# Patient Record
Sex: Male | Born: 1960 | Race: Black or African American | Hispanic: No | Marital: Married | Smoking: Never smoker
Health system: Southern US, Community
[De-identification: ages and names within clinical notes are randomized; demographics above are authoritative.]

## PROBLEM LIST (undated history)

## (undated) DIAGNOSIS — C787 Secondary malignant neoplasm of liver and intrahepatic bile duct: Principal | ICD-10-CM

## (undated) DIAGNOSIS — C189 Malignant neoplasm of colon, unspecified: Secondary | ICD-10-CM

## (undated) DIAGNOSIS — I10 Essential (primary) hypertension: Secondary | ICD-10-CM

## (undated) HISTORY — DX: Malignant neoplasm of colon, unspecified: C18.9

## (undated) HISTORY — DX: Essential (primary) hypertension: I10

## (undated) HISTORY — DX: Secondary malignant neoplasm of liver and intrahepatic bile duct: C78.7

---

## 2012-01-17 DIAGNOSIS — C189 Malignant neoplasm of colon, unspecified: Secondary | ICD-10-CM

## 2012-01-17 HISTORY — DX: Malignant neoplasm of colon, unspecified: C18.9

## 2012-01-17 HISTORY — PX: COLON RESECTION: SHX5231

## 2012-08-26 ENCOUNTER — Telehealth: Payer: Self-pay | Admitting: Oncology

## 2012-08-26 NOTE — Telephone Encounter (Signed)
C/D 08/26/12 for appt. 09/06/12

## 2012-09-05 ENCOUNTER — Other Ambulatory Visit: Payer: Self-pay | Admitting: Medical Oncology

## 2012-09-06 ENCOUNTER — Other Ambulatory Visit (HOSPITAL_BASED_OUTPATIENT_CLINIC_OR_DEPARTMENT_OTHER): Payer: 59 | Admitting: Lab

## 2012-09-06 ENCOUNTER — Encounter: Payer: Self-pay | Admitting: Oncology

## 2012-09-06 ENCOUNTER — Ambulatory Visit: Payer: 59

## 2012-09-06 ENCOUNTER — Other Ambulatory Visit: Payer: Self-pay | Admitting: *Deleted

## 2012-09-06 ENCOUNTER — Ambulatory Visit (HOSPITAL_BASED_OUTPATIENT_CLINIC_OR_DEPARTMENT_OTHER): Payer: 59 | Admitting: Oncology

## 2012-09-06 ENCOUNTER — Encounter: Payer: Self-pay | Admitting: *Deleted

## 2012-09-06 VITALS — BP 124/82 | HR 75 | Temp 98.4°F | Resp 20 | Ht 73.0 in | Wt 201.0 lb

## 2012-09-06 DIAGNOSIS — C189 Malignant neoplasm of colon, unspecified: Secondary | ICD-10-CM

## 2012-09-06 DIAGNOSIS — C7800 Secondary malignant neoplasm of unspecified lung: Secondary | ICD-10-CM

## 2012-09-06 DIAGNOSIS — C787 Secondary malignant neoplasm of liver and intrahepatic bile duct: Secondary | ICD-10-CM

## 2012-09-06 DIAGNOSIS — C185 Malignant neoplasm of splenic flexure: Secondary | ICD-10-CM

## 2012-09-06 HISTORY — DX: Malignant neoplasm of colon, unspecified: C18.9

## 2012-09-06 HISTORY — DX: Malignant neoplasm of colon, unspecified: C78.7

## 2012-09-06 LAB — COMPREHENSIVE METABOLIC PANEL (CC13)
Alkaline Phosphatase: 84 U/L (ref 40–150)
BUN: 12.7 mg/dL (ref 7.0–26.0)
CO2: 25 mEq/L (ref 22–29)
Creatinine: 1.4 mg/dL — ABNORMAL HIGH (ref 0.7–1.3)
Glucose: 98 mg/dl (ref 70–140)
Sodium: 141 mEq/L (ref 136–145)
Total Bilirubin: 0.98 mg/dL (ref 0.20–1.20)

## 2012-09-06 LAB — CBC WITH DIFFERENTIAL/PLATELET
Basophils Absolute: 0 10*3/uL (ref 0.0–0.1)
EOS%: 7.6 % — ABNORMAL HIGH (ref 0.0–7.0)
HGB: 12.2 g/dL — ABNORMAL LOW (ref 13.0–17.1)
LYMPH%: 31.4 % (ref 14.0–49.0)
MCH: 28.4 pg (ref 27.2–33.4)
MCV: 88.1 fL (ref 79.3–98.0)
MONO%: 18.7 % — ABNORMAL HIGH (ref 0.0–14.0)
NEUT%: 42.1 % (ref 39.0–75.0)
Platelets: 202 10*3/uL (ref 140–400)
RDW: 20.9 % — ABNORMAL HIGH (ref 11.0–14.6)

## 2012-09-06 MED ORDER — DEXAMETHASONE 4 MG PO TABS: 8.0000 mg | ORAL_TABLET | Freq: Two times a day (BID) | ORAL | Status: DC

## 2012-09-06 MED ORDER — LORAZEPAM 0.5 MG PO TABS: 0.5000 mg | ORAL_TABLET | Freq: Four times a day (QID) | ORAL | Status: DC | PRN

## 2012-09-06 MED ORDER — PROCHLORPERAZINE MALEATE 10 MG PO TABS: 10.0000 mg | ORAL_TABLET | Freq: Four times a day (QID) | ORAL | Status: DC | PRN

## 2012-09-06 MED ORDER — ONDANSETRON HCL 8 MG PO TABS: 8.0000 mg | ORAL_TABLET | Freq: Two times a day (BID) | ORAL | Status: DC

## 2012-09-06 NOTE — Progress Notes (Signed)
Checked in new patient. No financial issues. No POA/living will. email is correct.

## 2012-09-07 LAB — CEA: CEA: 1.6 ng/mL (ref 0.0–5.0)

## 2012-09-13 ENCOUNTER — Telehealth: Payer: Self-pay | Admitting: Oncology

## 2012-09-13 NOTE — Telephone Encounter (Signed)
Per email response from Seward Grater ok to schedule pt tx on Fridays (folfox/avastin) with pump d/c on Mondays. Per Seward Grater this will be a 72hr pump. Pt scheduled for Friday 8/1 as the start date for 7/25 has already passed. S/w pt and informed him that arrangements to have tx on Fridays has been approved but d/c will be Mondays. Pt given next appt for 8/1 that he requested be moved this one time to a Thursday 7/31 due to new job orientation on Friday 8/1. appt moved to 7/31 and pt given d/t for lb/tx 7/31 @ 8:15am and will get new schedule when he comes in 7/31. email to KK/Maggie.

## 2012-09-13 NOTE — Telephone Encounter (Signed)
Add to previous note.... Due to pt starting tx 8/1 and 7/25 all appts were adjusted 1 week.

## 2012-09-14 ENCOUNTER — Encounter: Payer: Self-pay | Admitting: *Deleted

## 2012-09-15 ENCOUNTER — Ambulatory Visit (HOSPITAL_COMMUNITY)
Admission: RE | Admit: 2012-09-15 | Discharge: 2012-09-15 | Disposition: A | Discharge status: Home / Self Care | Payer: 59 | Source: Ambulatory Visit | Attending: Oncology | Admitting: Oncology

## 2012-09-15 ENCOUNTER — Other Ambulatory Visit (HOSPITAL_BASED_OUTPATIENT_CLINIC_OR_DEPARTMENT_OTHER): Payer: 59 | Admitting: Lab

## 2012-09-15 ENCOUNTER — Ambulatory Visit (HOSPITAL_BASED_OUTPATIENT_CLINIC_OR_DEPARTMENT_OTHER): Payer: 59

## 2012-09-15 DIAGNOSIS — Z5111 Encounter for antineoplastic chemotherapy: Secondary | ICD-10-CM

## 2012-09-15 DIAGNOSIS — C189 Malignant neoplasm of colon, unspecified: Secondary | ICD-10-CM

## 2012-09-15 DIAGNOSIS — C787 Secondary malignant neoplasm of liver and intrahepatic bile duct: Secondary | ICD-10-CM | POA: Insufficient documentation

## 2012-09-15 DIAGNOSIS — Z452 Encounter for adjustment and management of vascular access device: Secondary | ICD-10-CM

## 2012-09-15 DIAGNOSIS — Z5112 Encounter for antineoplastic immunotherapy: Secondary | ICD-10-CM

## 2012-09-15 LAB — UA PROTEIN, DIPSTICK - CHCC: Protein, ur: NEGATIVE mg/dL

## 2012-09-15 LAB — COMPREHENSIVE METABOLIC PANEL (CC13)
BUN: 9.7 mg/dL (ref 7.0–26.0)
CO2: 23 mEq/L (ref 22–29)
Calcium: 9.7 mg/dL (ref 8.4–10.4)
Chloride: 105 mEq/L (ref 98–109)
Creatinine: 1.3 mg/dL (ref 0.7–1.3)

## 2012-09-15 LAB — CBC WITH DIFFERENTIAL/PLATELET
Basophils Absolute: 0 10*3/uL (ref 0.0–0.1)
EOS%: 18.4 % — ABNORMAL HIGH (ref 0.0–7.0)
HCT: 35.3 % — ABNORMAL LOW (ref 38.4–49.9)
HGB: 11.5 g/dL — ABNORMAL LOW (ref 13.0–17.1)
MCH: 29.1 pg (ref 27.2–33.4)
MONO#: 0.7 10*3/uL (ref 0.1–0.9)
NEUT%: 35.4 % — ABNORMAL LOW (ref 39.0–75.0)
Platelets: 193 10*3/uL (ref 140–400)
lymph#: 1.5 10*3/uL (ref 0.9–3.3)

## 2012-09-15 MED ORDER — FLUOROURACIL CHEMO INJECTION 2.5 GM/50ML
400.0000 mg/m2 | Freq: Once | INTRAVENOUS | Status: AC
  Administered 2012-09-15: 850 mg via INTRAVENOUS
  Filled 2012-09-15: qty 17

## 2012-09-15 MED ORDER — DEXAMETHASONE SODIUM PHOSPHATE 10 MG/ML IJ SOLN
10.0000 mg | Freq: Once | INTRAMUSCULAR | Status: AC
  Administered 2012-09-15: 10 mg via INTRAVENOUS

## 2012-09-15 MED ORDER — DEXTROSE 5 % IV SOLN
Freq: Once | INTRAVENOUS | Status: AC
  Administered 2012-09-15: 12:00:00 via INTRAVENOUS

## 2012-09-15 MED ORDER — ALTEPLASE 2 MG IJ SOLR
2.0000 mg | Freq: Once | INTRAMUSCULAR | Status: AC | PRN
  Administered 2012-09-15: 2 mg
  Filled 2012-09-15: qty 2

## 2012-09-15 MED ORDER — SODIUM CHLORIDE 0.9 % IV SOLN: Freq: Once | INTRAVENOUS | Status: DC

## 2012-09-15 MED ORDER — OXALIPLATIN CHEMO INJECTION 100 MG/20ML
85.0000 mg/m2 | Freq: Once | INTRAVENOUS | Status: AC
  Administered 2012-09-15: 185 mg via INTRAVENOUS
  Filled 2012-09-15: qty 37

## 2012-09-15 MED ORDER — ONDANSETRON 8 MG/50ML IVPB (CHCC)
8.0000 mg | Freq: Once | INTRAVENOUS | Status: AC
  Administered 2012-09-15: 8 mg via INTRAVENOUS

## 2012-09-15 MED ORDER — LEUCOVORIN CALCIUM INJECTION 350 MG
400.0000 mg/m2 | Freq: Once | INTRAVENOUS | Status: AC
  Administered 2012-09-15: 868 mg via INTRAVENOUS
  Filled 2012-09-15: qty 43.4

## 2012-09-15 MED ORDER — BEVACIZUMAB CHEMO INJECTION 400 MG/16ML
5.0000 mg/kg | Freq: Once | INTRAVENOUS | Status: AC
  Administered 2012-09-15: 450 mg via INTRAVENOUS
  Filled 2012-09-15: qty 18

## 2012-09-15 MED ORDER — SODIUM CHLORIDE 0.9 % IV SOLN
2400.0000 mg/m2 | INTRAVENOUS | Status: DC
  Administered 2012-09-15: 5200 mg via INTRAVENOUS
  Filled 2012-09-15: qty 104

## 2012-09-15 NOTE — Patient Instructions (Addendum)
[facility] Discharge Instructions for Patients Receiving Chemotherapy  Today you received the following chemotherapy agents Oxaliplatin/Leucovorin/5FU/Avastin.  To help prevent nausea and vomiting after your treatment, we encourage you to take your nausea medication as needed.   If you develop nausea and vomiting that is not controlled by your nausea medication, call the clinic.   BELOW ARE SYMPTOMS THAT SHOULD BE REPORTED IMMEDIATELY:  *FEVER GREATER THAN 100.5 F  *CHILLS WITH OR WITHOUT FEVER  NAUSEA AND VOMITING THAT IS NOT CONTROLLED WITH YOUR NAUSEA MEDICATION  *UNUSUAL SHORTNESS OF BREATH  *UNUSUAL BRUISING OR BLEEDING  TENDERNESS IN MOUTH AND THROAT WITH OR WITHOUT PRESENCE OF ULCERS  *URINARY PROBLEMS  *BOWEL PROBLEMS  UNUSUAL RASH Items with * indicate a potential emergency and should be followed up as soon as possible.  Feel free to call the clinic you have any questions or concerns. The clinic phone number is [phone].

## 2012-09-15 NOTE — Progress Notes (Signed)
Unable to get positive blood return from left chest PAC. PAC placed at [facility] in [place]. Sending patient for CXR to verify placement before instilling TPA or infusion of chemotherapy. 1035--TPA removed, excellent blood return, flushes well, drips to gravity.

## 2012-09-16 ENCOUNTER — Other Ambulatory Visit: Payer: 59 | Admitting: Lab

## 2012-09-16 ENCOUNTER — Ambulatory Visit: Payer: 59

## 2012-09-17 ENCOUNTER — Ambulatory Visit (HOSPITAL_BASED_OUTPATIENT_CLINIC_OR_DEPARTMENT_OTHER): Payer: 59

## 2012-09-17 VITALS — BP 153/86 | HR 62 | Temp 98.6°F

## 2012-09-17 DIAGNOSIS — Z5189 Encounter for other specified aftercare: Secondary | ICD-10-CM

## 2012-09-17 DIAGNOSIS — C189 Malignant neoplasm of colon, unspecified: Secondary | ICD-10-CM

## 2012-09-17 DIAGNOSIS — C787 Secondary malignant neoplasm of liver and intrahepatic bile duct: Secondary | ICD-10-CM

## 2012-09-17 MED ORDER — PEGFILGRASTIM INJECTION 6 MG/0.6ML
6.0000 mg | Freq: Once | SUBCUTANEOUS | Status: AC
  Administered 2012-09-17: 6 mg via SUBCUTANEOUS

## 2012-09-17 MED ORDER — HEPARIN SOD (PORK) LOCK FLUSH 100 UNIT/ML IV SOLN
500.0000 [IU] | Freq: Once | INTRAVENOUS | Status: AC | PRN
  Administered 2012-09-17: 500 [IU]
  Filled 2012-09-17: qty 5

## 2012-09-17 MED ORDER — SODIUM CHLORIDE 0.9 % IJ SOLN
10.0000 mL | INTRAMUSCULAR | Status: DC | PRN
  Administered 2012-09-17: 10 mL
  Filled 2012-09-17: qty 10

## 2012-09-17 NOTE — Patient Instructions (Addendum)

## 2012-09-29 NOTE — Progress Notes (Signed)
[facility]  [phone] [phone]   MEDICAL ONCOLOGY - INITIAL CONSULATION    Referral MD Dr. Gaylord Shih  Reason for Referral: 52 year old gentleman with metastatic colon carcinoma.  Chief Complaint  Patient presents with  . new patient  : metastatic colon cancer Osteoarthritis Hypertension  HPI: patient is a very pleasant 52 year old gentleman with metastatic colon carcinoma who originally was diagnosed in [place] and use [place]. He is now transferring his care to [facility]. His oncologic history dates back to   December 2013: when patient developed weeks of mild abdominal pain or decreased appetite nausea vomiting. He had his CT in the ER that revealed inflammatory: Mass at the splenic flexure metastasis were obvious liver no white neuropathy.   December 2013: he was taken to the operating room for a large bowel obstruction under went partial colectomy with site to.side functional and N. To end staple anastomosis. Pathology revealed invasive adenocarcinoma low grade 6.0 cm tumor no lymphovascular invasion or perineural invasion. 0 of 15 lymph nodes were positive margins were negative. Pathologic stage TIII N0 DNA MMR staining by immunohistochemistry was intact K.RAS mutation not found at exon 1 &2 (including codone 12, 13, 14, 61)  January 2014 CEA level was elevated at 120.  February 2014:  CT chest abdomen pelvis: Multiple bilateral lung masses at least 5 lesions in each lung, 2 cm lesion noted in segment of liver, multiple small mesenteric lymph nodes which were stable and not pathologically enlarged no bony lesions.  February 2014 right liver biopsy of segment 5: Metastatic adenocarcinoma consistent with colon origin, positive for CK 20 CDX2 and villin negative for PSA TTF-1 and CK 25 March 2012: Started on modified FOLFOX 6+ Avastin chemotherapy  April 2014 CEA level 35 after third cycle of treatment CT chest  abdomen pelvis: Stable bilateral subcentimeter pulmonary nodules mild decrease in left lower lobe nodule decrease in size of lesion within the right hepatic lobe no new lesions seen  May 2014 CEA level 11.22 July 2012:  completed eighth cycle of treatment with modified FOLFOX 6+ Avastin chemotherapy     Past Medical History  Diagnosis Date  . Colon cancer 01/2012    stage IV   . 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 KLOR-CON M20 20 MEQ tablet Take 20 mEq by mouth daily.      Marland Kitchen losartan-hydrochlorothiazide (HYZAAR) 100-25 MG per tablet Take by mouth daily.      Marland Kitchen dexamethasone (DECADRON) 4 MG tablet Take 2 tablets (8 mg total) by mouth 2 (two) times daily with a meal. Take daily starting the day after chemotherapy for 2 days. Take with food.  30 tablet  1  . LORazepam (ATIVAN) 0.5 MG tablet Take 1 tablet (0.5 mg total) by mouth every 6 (six) hours as needed (Nausea or vomiting).  30 tablet  0  . ondansetron (ZOFRAN) 8 MG tablet Take 1 tablet (8 mg total) by mouth 2 (two) times daily. Take two times a day starting the day after chemo for 2 days. Then take two times a day as needed for nausea or vomiting.  30 tablet  1  . prochlorperazine (COMPAZINE) 10 MG tablet Take 1 tablet (10 mg total) by mouth every 6 (six) hours as needed (Nausea or vomiting).  30 tablet  1   No current facility-administered medications for this visit.  Allergies  Allergen Reactions  . Other Hives and Swelling    Sea food  :  No family history on file.:  History   Social History  . Marital Status: Married    Spouse Name: N/A    Number of Children: N/A  . Years of Education: N/A   Occupational History  . Not on file.   Social History Main Topics  . Smoking status: Not on file  . Smokeless tobacco: Not on file  . Alcohol Use: Not on file  . Drug Use: Not on file  .  Sexual Activity: Not on file   Other Topics Concern  . Not on file   Social History Narrative  . No narrative on file  :  A comprehensive review of systems was negative.  Exam: @IPVITALS @  General:  well-nourished in no acute distress.  Eyes:  no scleral icterus.  ENT:  There were no oropharyngeal lesions.  Neck was without thyromegaly.  Lymphatics:  Negative cervical, supraclavicular or axillary adenopathy.  Respiratory: lungs were clear bilaterally without wheezing or crackles.  Cardiovascular:  Regular rate and rhythm, S1/S2, without murmur, rub or gallop.  There was no pedal edema.  GI:  abdomen was soft, flat, nontender, nondistended, without organomegaly.  Muscoloskeletal:  no spinal tenderness of palpation of vertebral spine.  Skin exam was without echymosis, petichae.  Neuro exam was nonfocal.  Patient was able to get on and off exam table without assistance.  Gait was normal.  Patient was alerted and oriented.  Attention was good.   Language was appropriate.  Mood was normal without depression.  Speech was not pressured.  Thought content was not tangential.     Lab Results  Component Value Date   WBC 5.0 09/15/2012   HGB 11.5* 09/15/2012   HCT 35.3* 09/15/2012   PLT 193 09/15/2012   GLUCOSE 144* 09/15/2012   ALT 15 09/15/2012   AST 27 09/15/2012   NA 141 09/15/2012   K 3.8 09/15/2012   CREATININE 1.3 09/15/2012   BUN 9.7 09/15/2012   CO2 23 09/15/2012    Dg Chest 2 View  09/15/2012   *RADIOLOGY REPORT*  Clinical Data: Port-A-Cath placement.  CHEST - 2 VIEW  Comparison: No priors.  Findings: Left-sided subclavian Port-A-Cath with tip terminating in the distal superior vena cava. Lung volumes are normal.  No consolidative airspace disease.  No pleural effusions.  No pneumothorax.  No pulmonary nodule or mass noted.  Pulmonary vasculature and the cardiomediastinal silhouette are within normal limits.  IMPRESSION: 1. No radiographic evidence of acute cardiopulmonary disease. 2.  Tip of  Port-A-Cath in the distal superior vena cava.   Original Report Authenticated By: Trudie Reed, M.D.    Dg Chest 2 View  09/15/2012   *RADIOLOGY REPORT*  Clinical Data: Port-A-Cath placement.  CHEST - 2 VIEW  Comparison: No priors.  Findings: Left-sided subclavian Port-A-Cath with tip terminating in the distal superior vena cava. Lung volumes are normal.  No consolidative airspace disease.  No pleural effusions.  No pneumothorax.  No pulmonary nodule or mass noted.  Pulmonary vasculature and the cardiomediastinal silhouette are within normal limits.  IMPRESSION: 1. No radiographic evidence of acute cardiopulmonary disease. 2.  Tip of Port-A-Cath in the distal superior vena cava.   Original Report Authenticated By: Trudie Reed, M.D.    Assessment and Plan: 52 year old gentleman with a T3, N0, M1 invasive adenocarcinoma of the colon at the splenic flexure diagnosed in December 2013. He was initially thought to have stage  II disease but on initial medical oncology workup he was found to have an elevated CEA level necessitating further staging workup. Pulmonary and liver metastasis were found on CT scan in February 2014. The patient was asymptomatic. He was started on FOLFOX 6+ Avastin based chemotherapy and tolerated it extremely well with improvement in CEA level and response on CT scans in April 2014.  June 2014 he received 8 of 12 planned cycle of modified FOLFOX 6+ Avastin chemotherapy. The patient does have cold sensitivity in her hands and feet but he is asymptomatic at room temperature. He is also noticed darkening of the skin of her palms and hands. He is a very high functional status.  Plan #1 metastatic colon cancer with lung and liver metastasis stage IV KRAS wild type, diagnosed December 2013. He has completed eighth cycle of modified FOLFOX 6+ Avastin chemotherapy in [place]. But now he is going to proceed with cycle #9 here at [facility].  #2 patient will need to  do restaging CT scans eventually. 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  #4 liver metastasis these are felt not to be resectable due to the presence of additional metastases in the lung it is unclear whether chemoembolization may or may not be benefit  #5 hypertension unclear whether this is due to Avastin we will continue to control it he is on losartan hydrochlorothiazide 100/25 mg one tablet daily.  #6 patient will proceed with cycle #9 of FOLFOX 6 and Avastin this coming Friday. Patient would like to have Friday appointments due to his workup. The we will try to accommodate him.  Drue Second, MD Medical/Oncology [facility] [phone] (beeper) [phone] (Office)

## 2012-09-30 ENCOUNTER — Encounter: Payer: Self-pay | Admitting: Oncology

## 2012-09-30 ENCOUNTER — Other Ambulatory Visit (HOSPITAL_BASED_OUTPATIENT_CLINIC_OR_DEPARTMENT_OTHER): Payer: 59 | Admitting: Lab

## 2012-09-30 ENCOUNTER — Ambulatory Visit (HOSPITAL_BASED_OUTPATIENT_CLINIC_OR_DEPARTMENT_OTHER): Payer: 59 | Admitting: Oncology

## 2012-09-30 ENCOUNTER — Ambulatory Visit (HOSPITAL_BASED_OUTPATIENT_CLINIC_OR_DEPARTMENT_OTHER): Payer: 59

## 2012-09-30 VITALS — BP 138/92 | HR 70 | Temp 98.2°F | Resp 18 | Ht 73.0 in | Wt 208.7 lb

## 2012-09-30 DIAGNOSIS — G589 Mononeuropathy, unspecified: Secondary | ICD-10-CM

## 2012-09-30 DIAGNOSIS — I10 Essential (primary) hypertension: Secondary | ICD-10-CM

## 2012-09-30 DIAGNOSIS — C185 Malignant neoplasm of splenic flexure: Secondary | ICD-10-CM

## 2012-09-30 DIAGNOSIS — Z5112 Encounter for antineoplastic immunotherapy: Secondary | ICD-10-CM

## 2012-09-30 DIAGNOSIS — C787 Secondary malignant neoplasm of liver and intrahepatic bile duct: Secondary | ICD-10-CM

## 2012-09-30 DIAGNOSIS — Z5111 Encounter for antineoplastic chemotherapy: Secondary | ICD-10-CM

## 2012-09-30 DIAGNOSIS — C7800 Secondary malignant neoplasm of unspecified lung: Secondary | ICD-10-CM

## 2012-09-30 DIAGNOSIS — C189 Malignant neoplasm of colon, unspecified: Secondary | ICD-10-CM

## 2012-09-30 LAB — CBC WITH DIFFERENTIAL/PLATELET
Basophils Absolute: 0 10*3/uL (ref 0.0–0.1)
Eosinophils Absolute: 0.3 10*3/uL (ref 0.0–0.5)
LYMPH%: 36.2 % (ref 14.0–49.0)
MCV: 90.1 fL (ref 79.3–98.0)
MONO%: 15.3 % — ABNORMAL HIGH (ref 0.0–14.0)
NEUT#: 1.8 10*3/uL (ref 1.5–6.5)
Platelets: 142 10*3/uL (ref 140–400)
RBC: 4.04 10*6/uL — ABNORMAL LOW (ref 4.20–5.82)

## 2012-09-30 LAB — COMPREHENSIVE METABOLIC PANEL (CC13)
Alkaline Phosphatase: 99 U/L (ref 40–150)
BUN: 10.9 mg/dL (ref 7.0–26.0)
Creatinine: 1.1 mg/dL (ref 0.7–1.3)
Glucose: 111 mg/dl (ref 70–140)
Sodium: 140 mEq/L (ref 136–145)
Total Bilirubin: 0.28 mg/dL (ref 0.20–1.20)

## 2012-09-30 MED ORDER — DEXAMETHASONE SODIUM PHOSPHATE 10 MG/ML IJ SOLN
10.0000 mg | Freq: Once | INTRAMUSCULAR | Status: AC
  Administered 2012-09-30: 10 mg via INTRAVENOUS

## 2012-09-30 MED ORDER — LEUCOVORIN CALCIUM INJECTION 350 MG
400.0000 mg/m2 | Freq: Once | INTRAVENOUS | Status: AC
  Administered 2012-09-30: 868 mg via INTRAVENOUS
  Filled 2012-09-30: qty 43.4

## 2012-09-30 MED ORDER — ONDANSETRON 8 MG/50ML IVPB (CHCC)
8.0000 mg | Freq: Once | INTRAVENOUS | Status: AC
  Administered 2012-09-30: 8 mg via INTRAVENOUS

## 2012-09-30 MED ORDER — FLUOROURACIL CHEMO INJECTION 2.5 GM/50ML
400.0000 mg/m2 | Freq: Once | INTRAVENOUS | Status: AC
  Administered 2012-09-30: 850 mg via INTRAVENOUS
  Filled 2012-09-30: qty 17

## 2012-09-30 MED ORDER — DEXTROSE 5 % IV SOLN
Freq: Once | INTRAVENOUS | Status: AC
  Administered 2012-09-30: 12:00:00 via INTRAVENOUS

## 2012-09-30 MED ORDER — OXALIPLATIN CHEMO INJECTION 100 MG/20ML
85.0000 mg/m2 | Freq: Once | INTRAVENOUS | Status: AC
  Administered 2012-09-30: 185 mg via INTRAVENOUS
  Filled 2012-09-30: qty 37

## 2012-09-30 MED ORDER — SODIUM CHLORIDE 0.9 % IV SOLN
5.0000 mg/kg | Freq: Once | INTRAVENOUS | Status: AC
  Administered 2012-09-30: 450 mg via INTRAVENOUS
  Filled 2012-09-30: qty 18

## 2012-09-30 MED ORDER — SODIUM CHLORIDE 0.9 % IV SOLN
Freq: Once | INTRAVENOUS | Status: AC
  Administered 2012-09-30: 11:00:00 via INTRAVENOUS

## 2012-09-30 MED ORDER — SODIUM CHLORIDE 0.9 % IV SOLN
2400.0000 mg/m2 | INTRAVENOUS | Status: DC
  Administered 2012-09-30: 5200 mg via INTRAVENOUS
  Filled 2012-09-30: qty 104

## 2012-09-30 NOTE — Progress Notes (Signed)
OFFICE PROGRESS NOTE  CC**  No PCP Per Patient [address] [place] [address] Dr. Gaylord Shih  DIAGNOSIS: 52 year old gentleman with metastatic colon carcinoma who originally was diagnosed in [place] and use [place]   PRIOR THERAPY: #17 January 2012: when patient developed weeks of mild abdominal pain or decreased appetite nausea vomiting. He had his CT in the ER that revealed inflammatory: Mass at the splenic flexure metastasis were obvious liver no white neuropathy.  December 2013: he was taken to the operating room for a large bowel obstruction under went partial colectomy with site to.side functional and N. To end staple anastomosis. Pathology revealed invasive adenocarcinoma low grade 6.0 cm tumor no lymphovascular invasion or perineural invasion. 0 of 15 lymph nodes were positive margins were negative. Pathologic stage TIII N0 DNA MMR staining by immunohistochemistry was intact K.RAS mutation not found at exon 1 &2 (including codone 12, 13, 14, 61)  #18 February 2012 CEA level was elevated at 120.  February 2014: CT chest abdomen pelvis: Multiple bilateral lung masses at least 5 lesions in each lung, 2 cm lesion noted in segment of liver, multiple small mesenteric lymph nodes which were stable and not pathologically enlarged no bony lesions.  February 2014 right liver biopsy of segment 5: Metastatic adenocarcinoma consistent with colon origin, positive for CK 20 CDX2 and villin negative for PSA TTF-1 and CK 7    #21 March 2012: Started on modified FOLFOX 6+ Avastin chemotherapy  April 2014 CEA level 35 after third cycle of treatment CT chest abdomen pelvis: Stable bilateral subcentimeter pulmonary nodules mild decrease in left lower lobe nodule decrease in size of lesion within the right hepatic lobe no new lesions seen  May 2014 CEA level 11.22 July 2012: completed eighth cycle of treatment with modified FOLFOX 6+ Avastin chemotherapy    CURRENT THERAPY: Cycle 9 of  modified FOLFOX/Avastin with day 4 Neulasta  INTERVAL HISTORY: Maccoy Haubner 52 y.o. male returns for followup visit today prior to cycle #9 of FOLFOX. Overall he is doing remarkably well. He denies any fevers chills night sweats headaches shortness of breath chest pains palpitations or cold sensitivities. Remainder of the 10 point review of systems is  MEDICAL HISTORY: Past Medical History  Diagnosis Date  . Colon cancer 01/2012    stage IV   . 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  MEDICATIONS:  Current Outpatient Prescriptions  Medication Sig Dispense Refill  . dexamethasone (DECADRON) 4 MG tablet Take 2 tablets (8 mg total) by mouth 2 (two) times daily with a meal. Take daily starting the day after chemotherapy for 2 days. Take with food.  30 tablet  1  . LORazepam (ATIVAN) 0.5 MG tablet Take 1 tablet (0.5 mg total) by mouth every 6 (six) hours as needed (Nausea or vomiting).  30 tablet  0  . losartan-hydrochlorothiazide (HYZAAR) 100-25 MG per tablet Take by mouth daily.      . ondansetron (ZOFRAN) 8 MG tablet Take 1 tablet (8 mg total) by mouth 2 (two) times daily. Take two times a day starting the day after chemo for 2 days. Then take two times a day as needed for nausea or vomiting.  30 tablet  1  . prochlorperazine (COMPAZINE) 10 MG tablet Take 1 tablet (10 mg total) by mouth every 6 (six) hours as needed (Nausea or vomiting).  30 tablet  1  . KLOR-CON M20 20 MEQ tablet Take 20  mEq by mouth daily.       No current facility-administered medications for this visit.    SURGICAL HISTORY: History reviewed. No pertinent past surgical history.  REVIEW OF SYSTEMS:  Pertinent items are noted in HPI.   HEALTH MAINTENANCE:  PHYSICAL EXAMINATION: Blood pressure 138/92, pulse 70, temperature 98.2 F (36.8 C), temperature source Oral, resp. rate 18, height 6\' 1"  (1.854 m), weight 208 lb 11.2 oz (94.666 kg), SpO2 100.00%. Body mass  index is 27.54 kg/(m^2). ECOG PERFORMANCE STATUS: 0 - Asymptomatic   General appearance: alert, cooperative and appears stated age Resp: clear to auscultation bilaterally Cardio: regular rate and rhythm GI: soft, non-tender; bowel sounds normal; no masses,  no organomegaly Extremities: extremities normal, atraumatic, no cyanosis or edema Neurologic: Grossly normal   LABORATORY DATA: Lab Results  Component Value Date   WBC 4.4 09/30/2012   HGB 11.5* 09/30/2012   HCT 36.4* 09/30/2012   MCV 90.1 09/30/2012   PLT 142 09/30/2012      Chemistry      Component Value Date/Time   NA 140 09/30/2012 0817   K 3.5 09/30/2012 0817   CO2 24 09/30/2012 0817   BUN 10.9 09/30/2012 0817   CREATININE 1.1 09/30/2012 0817      Component Value Date/Time   CALCIUM 9.3 09/30/2012 0817   ALKPHOS 99 09/30/2012 0817   AST 25 09/30/2012 0817   ALT 12 09/30/2012 0817   BILITOT 0.28 09/30/2012 0817       RADIOGRAPHIC STUDIES:  Dg Chest 2 View  09/15/2012   *RADIOLOGY REPORT*  Clinical Data: Port-A-Cath placement.  CHEST - 2 VIEW  Comparison: No priors.  Findings: Left-sided subclavian Port-A-Cath with tip terminating in the distal superior vena cava. Lung volumes are normal.  No consolidative airspace disease.  No pleural effusions.  No pneumothorax.  No pulmonary nodule or mass noted.  Pulmonary vasculature and the cardiomediastinal silhouette are within normal limits.  IMPRESSION: 1. No radiographic evidence of acute cardiopulmonary disease. 2.  Tip of Port-A-Cath in the distal superior vena cava.   Original Report Authenticated By: Trudie Reed, M.D.    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 Patient is receiving palliative chemotherapy with modified FOLFOX 6 and Avastin. He is now here for cycle #9 of his treatment.  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  #3 neuropathy: Stable   PLAN:   #1 proceed  with modified FOLFOX 6 and Avastin today.  #2 he will return on August 18 to have his pump disconnected and he will receive Neulasta.  #3 I will see him back in 2 weeks' time for cycle #10 of his chemotherapy.   All questions were answered. The patient knows to call the clinic with any problems, questions or concerns. We can certainly see the patient much sooner if necessary.  I spent 25 minutes counseling the patient face to face. The total time spent in the appointment was 30 minutes.    Drue Second, MD Medical/Oncology [facility] [phone] (beeper) [phone] (Office)  09/30/2012, 9:22 AM

## 2012-09-30 NOTE — Patient Instructions (Signed)
[facility] Discharge Instructions for Patients Receiving Chemotherapy  Today you received the following chemotherapy agents : Avastin, Oxaliplatin, Leucovorin, 5FU.  To help prevent nausea and vomiting after your treatment, we encourage you to take your nausea medication as instructed by your physician.   If you develop nausea and vomiting that is not controlled by your nausea medication, call the clinic.   BELOW ARE SYMPTOMS THAT SHOULD BE REPORTED IMMEDIATELY:  *FEVER GREATER THAN 100.5 F  *CHILLS WITH OR WITHOUT FEVER  NAUSEA AND VOMITING THAT IS NOT CONTROLLED WITH YOUR NAUSEA MEDICATION  *UNUSUAL SHORTNESS OF BREATH  *UNUSUAL BRUISING OR BLEEDING  TENDERNESS IN MOUTH AND THROAT WITH OR WITHOUT PRESENCE OF ULCERS  *URINARY PROBLEMS  *BOWEL PROBLEMS  UNUSUAL RASH Items with * indicate a potential emergency and should be followed up as soon as possible.  Feel free to call the clinic you have any questions or concerns. The clinic phone number is [phone].

## 2012-10-03 ENCOUNTER — Ambulatory Visit (HOSPITAL_BASED_OUTPATIENT_CLINIC_OR_DEPARTMENT_OTHER): Payer: 59

## 2012-10-03 DIAGNOSIS — C189 Malignant neoplasm of colon, unspecified: Secondary | ICD-10-CM

## 2012-10-03 DIAGNOSIS — C185 Malignant neoplasm of splenic flexure: Secondary | ICD-10-CM

## 2012-10-03 DIAGNOSIS — C7800 Secondary malignant neoplasm of unspecified lung: Secondary | ICD-10-CM

## 2012-10-03 DIAGNOSIS — C787 Secondary malignant neoplasm of liver and intrahepatic bile duct: Secondary | ICD-10-CM

## 2012-10-03 DIAGNOSIS — Z5189 Encounter for other specified aftercare: Secondary | ICD-10-CM

## 2012-10-03 MED ORDER — HEPARIN SOD (PORK) LOCK FLUSH 100 UNIT/ML IV SOLN
500.0000 [IU] | Freq: Once | INTRAVENOUS | Status: AC | PRN
  Administered 2012-10-03: 500 [IU]
  Filled 2012-10-03: qty 5

## 2012-10-03 MED ORDER — PEGFILGRASTIM INJECTION 6 MG/0.6ML
6.0000 mg | Freq: Once | SUBCUTANEOUS | Status: AC
  Administered 2012-10-03: 6 mg via SUBCUTANEOUS
  Filled 2012-10-03: qty 0.6

## 2012-10-03 MED ORDER — SODIUM CHLORIDE 0.9 % IJ SOLN
10.0000 mL | INTRAMUSCULAR | Status: DC | PRN
  Administered 2012-10-03: 10 mL
  Filled 2012-10-03: qty 10

## 2012-10-03 NOTE — Progress Notes (Signed)
Patient had 20 mL left to infuse. Patient stated he had to go to work and could not return later today for pump D/C. Augustin Schooling, NP notified. Pump removed port de accessed, neulasta injection given.

## 2012-10-14 ENCOUNTER — Other Ambulatory Visit (HOSPITAL_BASED_OUTPATIENT_CLINIC_OR_DEPARTMENT_OTHER): Payer: 59

## 2012-10-14 ENCOUNTER — Ambulatory Visit: Payer: 59

## 2012-10-14 ENCOUNTER — Telehealth: Payer: Self-pay | Admitting: Oncology

## 2012-10-14 ENCOUNTER — Ambulatory Visit (HOSPITAL_BASED_OUTPATIENT_CLINIC_OR_DEPARTMENT_OTHER): Payer: 59 | Admitting: Oncology

## 2012-10-14 ENCOUNTER — Encounter: Payer: Self-pay | Admitting: Oncology

## 2012-10-14 VITALS — BP 157/89 | HR 71 | Temp 98.1°F | Resp 20 | Ht 73.0 in | Wt 202.2 lb

## 2012-10-14 DIAGNOSIS — C189 Malignant neoplasm of colon, unspecified: Secondary | ICD-10-CM

## 2012-10-14 DIAGNOSIS — C7800 Secondary malignant neoplasm of unspecified lung: Secondary | ICD-10-CM

## 2012-10-14 DIAGNOSIS — C787 Secondary malignant neoplasm of liver and intrahepatic bile duct: Secondary | ICD-10-CM

## 2012-10-14 DIAGNOSIS — G609 Hereditary and idiopathic neuropathy, unspecified: Secondary | ICD-10-CM

## 2012-10-14 DIAGNOSIS — C185 Malignant neoplasm of splenic flexure: Secondary | ICD-10-CM

## 2012-10-14 DIAGNOSIS — I10 Essential (primary) hypertension: Secondary | ICD-10-CM

## 2012-10-14 LAB — COMPREHENSIVE METABOLIC PANEL (CC13)
ALT: 13 U/L (ref 0–55)
Albumin: 3.4 g/dL — ABNORMAL LOW (ref 3.5–5.0)
CO2: 25 mEq/L (ref 22–29)
Calcium: 9.4 mg/dL (ref 8.4–10.4)
Chloride: 106 mEq/L (ref 98–109)
Creatinine: 1.1 mg/dL (ref 0.7–1.3)
Potassium: 3.6 mEq/L (ref 3.5–5.1)
Total Protein: 7.5 g/dL (ref 6.4–8.3)

## 2012-10-14 LAB — CBC WITH DIFFERENTIAL/PLATELET
BASO%: 0.5 % (ref 0.0–2.0)
EOS%: 0.9 % (ref 0.0–7.0)
HCT: 35.6 % — ABNORMAL LOW (ref 38.4–49.9)
LYMPH%: 22.1 % (ref 14.0–49.0)
MCH: 29.2 pg (ref 27.2–33.4)
MCHC: 32.6 g/dL (ref 32.0–36.0)
NEUT%: 60.2 % (ref 39.0–75.0)
Platelets: 181 10*3/uL (ref 140–400)

## 2012-10-14 LAB — UA PROTEIN, DIPSTICK - CHCC: Protein, ur: NEGATIVE mg/dL

## 2012-10-14 MED ORDER — GABAPENTIN 100 MG PO CAPS: 100.0000 mg | ORAL_CAPSULE | Freq: Three times a day (TID) | ORAL | Status: DC

## 2012-10-14 MED ORDER — CEPHALEXIN 500 MG PO CAPS: 500.0000 mg | ORAL_CAPSULE | Freq: Four times a day (QID) | ORAL | Status: DC

## 2012-10-14 NOTE — Patient Instructions (Addendum)
Proceed with chemotherapy on 9/4  Keflex 500 mg four times a day  neurontin 100 mg three times a day   Take super B complex daily

## 2012-10-20 ENCOUNTER — Ambulatory Visit: Payer: 59 | Admitting: Oncology

## 2012-10-20 ENCOUNTER — Ambulatory Visit (HOSPITAL_BASED_OUTPATIENT_CLINIC_OR_DEPARTMENT_OTHER): Payer: 59 | Admitting: Oncology

## 2012-10-20 ENCOUNTER — Other Ambulatory Visit: Payer: 59 | Admitting: Lab

## 2012-10-20 ENCOUNTER — Encounter: Payer: Self-pay | Admitting: Oncology

## 2012-10-20 ENCOUNTER — Telehealth: Payer: Self-pay | Admitting: *Deleted

## 2012-10-20 ENCOUNTER — Ambulatory Visit (HOSPITAL_BASED_OUTPATIENT_CLINIC_OR_DEPARTMENT_OTHER): Payer: 59

## 2012-10-20 ENCOUNTER — Other Ambulatory Visit (HOSPITAL_BASED_OUTPATIENT_CLINIC_OR_DEPARTMENT_OTHER): Payer: 59 | Admitting: Lab

## 2012-10-20 VITALS — BP 135/82 | HR 69 | Temp 98.6°F | Resp 20 | Ht 73.0 in | Wt 205.0 lb

## 2012-10-20 DIAGNOSIS — Z5112 Encounter for antineoplastic immunotherapy: Secondary | ICD-10-CM

## 2012-10-20 DIAGNOSIS — Z5111 Encounter for antineoplastic chemotherapy: Secondary | ICD-10-CM

## 2012-10-20 DIAGNOSIS — C787 Secondary malignant neoplasm of liver and intrahepatic bile duct: Secondary | ICD-10-CM

## 2012-10-20 DIAGNOSIS — C7800 Secondary malignant neoplasm of unspecified lung: Secondary | ICD-10-CM

## 2012-10-20 DIAGNOSIS — C189 Malignant neoplasm of colon, unspecified: Secondary | ICD-10-CM

## 2012-10-20 DIAGNOSIS — C185 Malignant neoplasm of splenic flexure: Secondary | ICD-10-CM

## 2012-10-20 DIAGNOSIS — I158 Other secondary hypertension: Secondary | ICD-10-CM

## 2012-10-20 DIAGNOSIS — G622 Polyneuropathy due to other toxic agents: Secondary | ICD-10-CM

## 2012-10-20 LAB — CBC WITH DIFFERENTIAL/PLATELET
BASO%: 1 % (ref 0.0–2.0)
EOS%: 1.7 % (ref 0.0–7.0)
HGB: 11.6 g/dL — ABNORMAL LOW (ref 13.0–17.1)
MCH: 28.9 pg (ref 27.2–33.4)
MCHC: 32.2 g/dL (ref 32.0–36.0)
MCV: 89.7 fL (ref 79.3–98.0)
MONO%: 14.4 % — ABNORMAL HIGH (ref 0.0–14.0)
RBC: 4.03 10*6/uL — ABNORMAL LOW (ref 4.20–5.82)
RDW: 16.1 % — ABNORMAL HIGH (ref 11.0–14.6)
lymph#: 1.4 10*3/uL (ref 0.9–3.3)

## 2012-10-20 LAB — COMPREHENSIVE METABOLIC PANEL (CC13)
BUN: 16.3 mg/dL (ref 7.0–26.0)
Chloride: 106 mEq/L (ref 98–109)
Creatinine: 1.2 mg/dL (ref 0.7–1.3)
Glucose: 128 mg/dl (ref 70–140)
Potassium: 4 mEq/L (ref 3.5–5.1)
Sodium: 141 mEq/L (ref 136–145)
Total Bilirubin: 0.31 mg/dL (ref 0.20–1.20)
Total Protein: 7.5 g/dL (ref 6.4–8.3)

## 2012-10-20 LAB — UA PROTEIN, DIPSTICK - CHCC: Protein, ur: <30 mg/dL

## 2012-10-20 MED ORDER — SODIUM CHLORIDE 0.9 % IV SOLN
5.0000 mg/kg | Freq: Once | INTRAVENOUS | Status: AC
  Administered 2012-10-20: 450 mg via INTRAVENOUS
  Filled 2012-10-20: qty 18

## 2012-10-20 MED ORDER — OXALIPLATIN CHEMO INJECTION 100 MG/20ML
85.0000 mg/m2 | Freq: Once | INTRAVENOUS | Status: AC
  Administered 2012-10-20: 185 mg via INTRAVENOUS
  Filled 2012-10-20: qty 37

## 2012-10-20 MED ORDER — DEXAMETHASONE SODIUM PHOSPHATE 10 MG/ML IJ SOLN
10.0000 mg | Freq: Once | INTRAMUSCULAR | Status: AC
  Administered 2012-10-20: 10 mg via INTRAVENOUS

## 2012-10-20 MED ORDER — SODIUM CHLORIDE 0.9 % IV SOLN
Freq: Once | INTRAVENOUS | Status: AC
  Administered 2012-10-20: 14:00:00 via INTRAVENOUS

## 2012-10-20 MED ORDER — FLUOROURACIL CHEMO INJECTION 5 GM/100ML
2400.0000 mg/m2 | INTRAVENOUS | Status: DC
  Administered 2012-10-20: 5200 mg via INTRAVENOUS
  Filled 2012-10-20: qty 104

## 2012-10-20 MED ORDER — DEXTROSE 5 % IV SOLN
Freq: Once | INTRAVENOUS | Status: AC
  Administered 2012-10-20: 15:00:00 via INTRAVENOUS

## 2012-10-20 MED ORDER — FLUOROURACIL CHEMO INJECTION 2.5 GM/50ML
400.0000 mg/m2 | Freq: Once | INTRAVENOUS | Status: AC
  Administered 2012-10-20: 850 mg via INTRAVENOUS
  Filled 2012-10-20: qty 17

## 2012-10-20 MED ORDER — LEUCOVORIN CALCIUM INJECTION 350 MG
400.0000 mg/m2 | Freq: Once | INTRAVENOUS | Status: AC
  Administered 2012-10-20: 868 mg via INTRAVENOUS
  Filled 2012-10-20: qty 43.4

## 2012-10-20 MED ORDER — ONDANSETRON 8 MG/50ML IVPB (CHCC)
8.0000 mg | Freq: Once | INTRAVENOUS | Status: AC
  Administered 2012-10-20: 8 mg via INTRAVENOUS

## 2012-10-20 NOTE — Patient Instructions (Addendum)
Cancer Center Discharge Instructions for Patients Receiving Chemotherapy  Today you received the following chemotherapy agents Avastin/Oxaliplatin/Leucovorin/5FU.  To help prevent nausea and vomiting after your treatment, we encourage you to take your nausea medication as prescribed.   If you develop nausea and vomiting that is not controlled by your nausea medication, call the clinic.   BELOW ARE SYMPTOMS THAT SHOULD BE REPORTED IMMEDIATELY:  *FEVER GREATER THAN 100.5 F  *CHILLS WITH OR WITHOUT FEVER  NAUSEA AND VOMITING THAT IS NOT CONTROLLED WITH YOUR NAUSEA MEDICATION  *UNUSUAL SHORTNESS OF BREATH  *UNUSUAL BRUISING OR BLEEDING  TENDERNESS IN MOUTH AND THROAT WITH OR WITHOUT PRESENCE OF ULCERS  *URINARY PROBLEMS  *BOWEL PROBLEMS  UNUSUAL RASH Items with * indicate a potential emergency and should be followed up as soon as possible.  Feel free to call the clinic you have any questions or concerns. The clinic phone number is [phone].    

## 2012-10-20 NOTE — Telephone Encounter (Signed)
Per staff message and POF I have scheduled appts.  JMW  

## 2012-10-20 NOTE — Telephone Encounter (Signed)
appts made and printed. Pt  Is aware that tx will follow huis ov on 9/19, 10/3 and 10/17. i emailed MW to add those tx's...td

## 2012-10-21 ENCOUNTER — Other Ambulatory Visit: Payer: Self-pay | Admitting: Certified Registered Nurse Anesthetist

## 2012-10-22 ENCOUNTER — Ambulatory Visit (HOSPITAL_BASED_OUTPATIENT_CLINIC_OR_DEPARTMENT_OTHER): Payer: 59

## 2012-10-22 VITALS — BP 136/80 | HR 75 | Temp 98.0°F | Resp 20

## 2012-10-22 DIAGNOSIS — C787 Secondary malignant neoplasm of liver and intrahepatic bile duct: Secondary | ICD-10-CM

## 2012-10-22 DIAGNOSIS — C7800 Secondary malignant neoplasm of unspecified lung: Secondary | ICD-10-CM

## 2012-10-22 DIAGNOSIS — C185 Malignant neoplasm of splenic flexure: Secondary | ICD-10-CM

## 2012-10-22 DIAGNOSIS — Z5189 Encounter for other specified aftercare: Secondary | ICD-10-CM

## 2012-10-22 MED ORDER — HEPARIN SOD (PORK) LOCK FLUSH 100 UNIT/ML IV SOLN
500.0000 [IU] | Freq: Once | INTRAVENOUS | Status: AC | PRN
  Administered 2012-10-22: 500 [IU]
  Filled 2012-10-22: qty 5

## 2012-10-22 MED ORDER — SODIUM CHLORIDE 0.9 % IJ SOLN
10.0000 mL | INTRAMUSCULAR | Status: DC | PRN
  Administered 2012-10-22: 10 mL
  Filled 2012-10-22: qty 10

## 2012-10-22 MED ORDER — PEGFILGRASTIM INJECTION 6 MG/0.6ML
6.0000 mg | Freq: Once | SUBCUTANEOUS | Status: AC
  Administered 2012-10-22: 6 mg via SUBCUTANEOUS

## 2012-10-28 ENCOUNTER — Ambulatory Visit: Payer: 59 | Admitting: Oncology

## 2012-10-28 ENCOUNTER — Ambulatory Visit: Payer: 59

## 2012-10-28 ENCOUNTER — Other Ambulatory Visit: Payer: 59 | Admitting: Lab

## 2012-10-29 NOTE — Progress Notes (Signed)
OFFICE PROGRESS NOTE  CC**  No PCP Per Patient [address] [place] [address] Dr. Gaylord Shih  DIAGNOSIS: 52 year old gentleman with metastatic colon carcinoma who originally was diagnosed in [place] and use [place]   PRIOR THERAPY: #17 January 2012: when patient developed weeks of mild abdominal pain or decreased appetite nausea vomiting. He had his CT in the ER that revealed inflammatory: Mass at the splenic flexure metastasis were obvious liver no white neuropathy.  December 2013: he was taken to the operating room for a large bowel obstruction under went partial colectomy with site to.side functional and N. To end staple anastomosis. Pathology revealed invasive adenocarcinoma low grade 6.0 cm tumor no lymphovascular invasion or perineural invasion. 0 of 15 lymph nodes were positive margins were negative. Pathologic stage TIII N0 DNA MMR staining by immunohistochemistry was intact K.RAS mutation not found at exon 1 &2 (including codone 12, 13, 14, 61)  #18 February 2012 CEA level was elevated at 120.  February 2014: CT chest abdomen pelvis: Multiple bilateral lung masses at least 5 lesions in each lung, 2 cm lesion noted in segment of liver, multiple small mesenteric lymph nodes which were stable and not pathologically enlarged no bony lesions.  February 2014 right liver biopsy of segment 5: Metastatic adenocarcinoma consistent with colon origin, positive for CK 20 CDX2 and villin negative for PSA TTF-1 and CK 7    #21 March 2012: Started on modified FOLFOX 6+ Avastin chemotherapy  April 2014 CEA level 35 after third cycle of treatment CT chest abdomen pelvis: Stable bilateral subcentimeter pulmonary nodules mild decrease in left lower lobe nodule decrease in size of lesion within the right hepatic lobe no new lesions seen  May 2014 CEA level 11.22 July 2012: completed eighth cycle of treatment with modified FOLFOX 6+ Avastin chemotherapy    CURRENT THERAPY:  Here for  Cycle 10 of modified FOLFOX/Avastin with day 4 Neulasta  INTERVAL HISTORY: Christopher Fuentes 52 y.o. male returns for followup visit today prior to cycle #10 of FOLFOX. Overall he is doing remarkably well. He denies any fevers chills night sweats headaches shortness of breath chest pains palpitations or cold sensitivities. Remainder of the 10 point review of systems is negative  MEDICAL HISTORY: Past Medical History  Diagnosis Date  . Colon cancer 01/2012    stage IV   . 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  MEDICATIONS:  Current Outpatient Prescriptions  Medication Sig Dispense Refill  . losartan-hydrochlorothiazide (HYZAAR) 100-25 MG per tablet Take by mouth daily.      . cephALEXin (KEFLEX) 500 MG capsule Take 1 capsule (500 mg total) by mouth 4 (four) times daily.  28 capsule  1  . dexamethasone (DECADRON) 4 MG tablet Take 2 tablets (8 mg total) by mouth 2 (two) times daily with a meal. Take daily starting the day after chemotherapy for 2 days. Take with food.  30 tablet  1  . gabapentin (NEURONTIN) 100 MG capsule Take 1 capsule (100 mg total) by mouth 3 (three) times daily.  90 capsule  6  . KLOR-CON M20 20 MEQ tablet Take 20 mEq by mouth daily.      Marland Kitchen LORazepam (ATIVAN) 0.5 MG tablet Take 1 tablet (0.5 mg total) by mouth every 6 (six) hours as needed (Nausea or vomiting).  30 tablet  0  . ondansetron (ZOFRAN) 8 MG tablet Take 1 tablet (8 mg total) by mouth 2 (two) times  daily. Take two times a day starting the day after chemo for 2 days. Then take two times a day as needed for nausea or vomiting.  30 tablet  1  . prochlorperazine (COMPAZINE) 10 MG tablet Take 1 tablet (10 mg total) by mouth every 6 (six) hours as needed (Nausea or vomiting).  30 tablet  1   No current facility-administered medications for this visit.    SURGICAL HISTORY: History reviewed. No pertinent past surgical history.  REVIEW OF SYSTEMS:  Pertinent items  are noted in HPI.   HEALTH MAINTENANCE:  PHYSICAL EXAMINATION: Blood pressure 157/89, pulse 71, temperature 98.1 F (36.7 C), temperature source Oral, resp. rate 20, height 6\' 1"  (1.854 m), weight 202 lb 3.2 oz (91.717 kg). Body mass index is 26.68 kg/(m^2). ECOG PERFORMANCE STATUS: 0 - Asymptomatic   General appearance: alert, cooperative and appears stated age Resp: clear to auscultation bilaterally Cardio: regular rate and rhythm GI: soft, non-tender; bowel sounds normal; no masses,  no organomegaly Extremities: extremities normal, atraumatic, no cyanosis or edema Neurologic: Grossly normal   LABORATORY DATA: Lab Results  Component Value Date   WBC 6.8 10/20/2012   HGB 11.6* 10/20/2012   HCT 36.1* 10/20/2012   MCV 89.7 10/20/2012   PLT 186 10/20/2012      Chemistry      Component Value Date/Time   NA 141 10/20/2012 1139   K 4.0 10/20/2012 1139   CO2 28 10/20/2012 1139   BUN 16.3 10/20/2012 1139   CREATININE 1.2 10/20/2012 1139      Component Value Date/Time   CALCIUM 9.2 10/20/2012 1139   ALKPHOS 97 10/20/2012 1139   AST 28 10/20/2012 1139   ALT 16 10/20/2012 1139   BILITOT 0.31 10/20/2012 1139       RADIOGRAPHIC STUDIES:  Dg Chest 2 View  09/15/2012   *RADIOLOGY REPORT*  Clinical Data: Port-A-Cath placement.  CHEST - 2 VIEW  Comparison: No priors.  Findings: Left-sided subclavian Port-A-Cath with tip terminating in the distal superior vena cava. Lung volumes are normal.  No consolidative airspace disease.  No pleural effusions.  No pneumothorax.  No pulmonary nodule or mass noted.  Pulmonary vasculature and the cardiomediastinal silhouette are within normal limits.  IMPRESSION: 1. No radiographic evidence of acute cardiopulmonary disease. 2.  Tip of Port-A-Cath in the distal superior vena cava.   Original Report Authenticated By: Trudie Reed, M.D.    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 Patient is receiving  palliative chemotherapy with modified FOLFOX 6 and Avastin. He is now here for cycle #9 of his treatment.  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  #3 neuropathy: Stable   PLAN:   #1 due the labor day holiday coming up we will not start him on cycle 10 of chemo today.He will return on 10/19/12  #2 Peripheral neuropathy: begin neurontin and take superB complex.  All questions were answered. The patient knows to call the clinic with any problems, questions or concerns. We can certainly see the patient much sooner if necessary.  I spent 20 minutes counseling the patient face to face. The total time spent in the appointment was 30 minutes.    Drue Second, MD Medical/Oncology [facility] [phone] (beeper) [phone] (Office)

## 2012-11-04 ENCOUNTER — Other Ambulatory Visit: Payer: 59 | Admitting: Lab

## 2012-11-04 ENCOUNTER — Ambulatory Visit: Payer: 59 | Admitting: Oncology

## 2012-11-04 ENCOUNTER — Ambulatory Visit: Payer: 59

## 2012-11-05 NOTE — Progress Notes (Signed)
OFFICE PROGRESS NOTE  CC**  No PCP Per Patient [address] [place] [address] Dr. Gaylord Shih  DIAGNOSIS: 52 year old gentleman with metastatic colon carcinoma who originally was diagnosed in [place] and use [place]   PRIOR THERAPY: #17 January 2012: when patient developed weeks of mild abdominal pain or decreased appetite nausea vomiting. He had his CT in the ER that revealed inflammatory: Mass at the splenic flexure metastasis were obvious liver no white neuropathy.  December 2013: he was taken to the operating room for a large bowel obstruction under went partial colectomy with site to.side functional and N. To end staple anastomosis. Pathology revealed invasive adenocarcinoma low grade 6.0 cm tumor no lymphovascular invasion or perineural invasion. 0 of 15 lymph nodes were positive margins were negative. Pathologic stage TIII N0 DNA MMR staining by immunohistochemistry was intact K.RAS mutation not found at exon 1 &2 (including codone 12, 13, 14, 61)  #18 February 2012 CEA level was elevated at 120.  February 2014: CT chest abdomen pelvis: Multiple bilateral lung masses at least 5 lesions in each lung, 2 cm lesion noted in segment of liver, multiple small mesenteric lymph nodes which were stable and not pathologically enlarged no bony lesions.  February 2014 right liver biopsy of segment 5: Metastatic adenocarcinoma consistent with colon origin, positive for CK 20 CDX2 and villin negative for PSA TTF-1 and CK 7    #21 March 2012: Started on modified FOLFOX 6+ Avastin chemotherapy  April 2014 CEA level 35 after third cycle of treatment CT chest abdomen pelvis: Stable bilateral subcentimeter pulmonary nodules mild decrease in left lower lobe nodule decrease in size of lesion within the right hepatic lobe no new lesions seen  May 2014 CEA level 11.22 July 2012: completed eighth cycle of treatment with modified FOLFOX 6+ Avastin chemotherapy    CURRENT THERAPY:  Here for  Cycle 10 of modified FOLFOX/Avastin with day 4 Neulasta  INTERVAL HISTORY: Christopher Fuentes 52 y.o. male returns for followup visit today prior to cycle #10 of FOLFOX. Overall he is doing remarkably well. He denies any fevers chills night sweats headaches shortness of breath chest pains palpitations or cold sensitivities. He did notice a little bit more cold sensitivity and neuropathic type of pains. This is especially in his fingertips and his toes. It is not interfere with activities of daily living.Remainder of the 10 point review of systems is negative  MEDICAL HISTORY: Past Medical History  Diagnosis Date  . Colon cancer 01/2012    stage IV   . 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  MEDICATIONS:  Current Outpatient Prescriptions  Medication Sig Dispense Refill  . cephALEXin (KEFLEX) 500 MG capsule Take 1 capsule (500 mg total) by mouth 4 (four) times daily.  28 capsule  1  . dexamethasone (DECADRON) 4 MG tablet Take 2 tablets (8 mg total) by mouth 2 (two) times daily with a meal. Take daily starting the day after chemotherapy for 2 days. Take with food.  30 tablet  1  . gabapentin (NEURONTIN) 100 MG capsule Take 1 capsule (100 mg total) by mouth 3 (three) times daily.  90 capsule  6  . LORazepam (ATIVAN) 0.5 MG tablet Take 1 tablet (0.5 mg total) by mouth every 6 (six) hours as needed (Nausea or vomiting).  30 tablet  0  . losartan-hydrochlorothiazide (HYZAAR) 100-25 MG per tablet Take by mouth daily.      . ondansetron (ZOFRAN)  8 MG tablet Take 1 tablet (8 mg total) by mouth 2 (two) times daily. Take two times a day starting the day after chemo for 2 days. Then take two times a day as needed for nausea or vomiting.  30 tablet  1  . prochlorperazine (COMPAZINE) 10 MG tablet Take 1 tablet (10 mg total) by mouth every 6 (six) hours as needed (Nausea or vomiting).  30 tablet  1  . KLOR-CON M20 20 MEQ tablet Take 20 mEq by mouth daily.        No current facility-administered medications for this visit.    SURGICAL HISTORY: History reviewed. No pertinent past surgical history.  REVIEW OF SYSTEMS:  Pertinent items are noted in HPI.   HEALTH MAINTENANCE:  PHYSICAL EXAMINATION: Blood pressure 135/82, pulse 69, temperature 98.6 F (37 C), temperature source Oral, resp. rate 20, height 6\' 1"  (1.854 m), weight 205 lb (92.987 kg). Body mass index is 27.05 kg/(m^2). ECOG PERFORMANCE STATUS: 0 - Asymptomatic   General appearance: alert, cooperative and appears stated age Resp: clear to auscultation bilaterally Cardio: regular rate and rhythm GI: soft, non-tender; bowel sounds normal; no masses,  no organomegaly Extremities: extremities normal, atraumatic, no cyanosis or edema Neurologic: Grossly normal   LABORATORY DATA: Lab Results  Component Value Date   WBC 6.8 10/20/2012   HGB 11.6* 10/20/2012   HCT 36.1* 10/20/2012   MCV 89.7 10/20/2012   PLT 186 10/20/2012      Chemistry      Component Value Date/Time   NA 141 10/20/2012 1139   K 4.0 10/20/2012 1139   CO2 28 10/20/2012 1139   BUN 16.3 10/20/2012 1139   CREATININE 1.2 10/20/2012 1139      Component Value Date/Time   CALCIUM 9.2 10/20/2012 1139   ALKPHOS 97 10/20/2012 1139   AST 28 10/20/2012 1139   ALT 16 10/20/2012 1139   BILITOT 0.31 10/20/2012 1139       RADIOGRAPHIC STUDIES:  Dg Chest 2 View  09/15/2012   *RADIOLOGY REPORT*  Clinical Data: Port-A-Cath placement.  CHEST - 2 VIEW  Comparison: No priors.  Findings: Left-sided subclavian Port-A-Cath with tip terminating in the distal superior vena cava. Lung volumes are normal.  No consolidative airspace disease.  No pleural effusions.  No pneumothorax.  No pulmonary nodule or mass noted.  Pulmonary vasculature and the cardiomediastinal silhouette are within normal limits.  IMPRESSION: 1. No radiographic evidence of acute cardiopulmonary disease. 2.  Tip of Port-A-Cath in the distal superior vena cava.   Original Report  Authenticated By: Trudie Reed, M.D.    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 Patient is receiving palliative chemotherapy with modified FOLFOX 6 and Avastin. He is now here for cycle #9 of his treatment.  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  #3 neuropathy: progressive now   PLAN:   #1 patient will proceed with cycle 10 of FOLFOX/Avastin with day 4 Neulasta today. He will return in 3 days for a followup and DC pump.  #2 neuropathy is becoming a little more severe and progressive. I have recommended he begin Neurontin on a daily basis. Prescription was sent.  #3 he'll return in 2 weeks' time for followup and cycle 11 of chemotherapy appear   All questions were answered. The patient knows to call the clinic with any problems, questions or concerns. We can certainly see the patient much sooner if necessary.  I spent 25 minutes counseling  the patient face to face. The total time spent in the appointment was 25 minutes.    Drue Second, MD Medical/Oncology [facility] [phone] (beeper) [phone] (Office)

## 2012-11-07 ENCOUNTER — Telehealth: Payer: Self-pay | Admitting: *Deleted

## 2012-11-07 ENCOUNTER — Telehealth: Payer: Self-pay | Admitting: Medical Oncology

## 2012-11-07 ENCOUNTER — Ambulatory Visit: Payer: 59

## 2012-11-07 NOTE — Telephone Encounter (Signed)
Per pof.per orders LCM pt is aware of his appts...td

## 2012-11-07 NOTE — Telephone Encounter (Signed)
Patient called this morning concerned d/t was informed that he had an appt for tx today and was only told about this morning. States he cannot make this appt d/t work scheduling conflict but can make appt for Tuesday 09/23. Informed patient only appt avail for tx tomorrow is @ 1pm patient said that would work. Confirmed with patient to arrive at 1145 for labs, 1215 for appt with NP and 1:00 for tx. Patient confirmed appts, verbalized thanks.   Reviewed with MD and NP. Onc tx sent.

## 2012-11-08 ENCOUNTER — Ambulatory Visit (HOSPITAL_BASED_OUTPATIENT_CLINIC_OR_DEPARTMENT_OTHER): Payer: 59 | Admitting: Adult Health

## 2012-11-08 ENCOUNTER — Encounter: Payer: Self-pay | Admitting: Adult Health

## 2012-11-08 ENCOUNTER — Ambulatory Visit (HOSPITAL_BASED_OUTPATIENT_CLINIC_OR_DEPARTMENT_OTHER): Payer: 59

## 2012-11-08 ENCOUNTER — Other Ambulatory Visit (HOSPITAL_BASED_OUTPATIENT_CLINIC_OR_DEPARTMENT_OTHER): Payer: 59 | Admitting: Lab

## 2012-11-08 VITALS — BP 135/81 | HR 80 | Temp 98.6°F | Resp 20 | Ht 73.0 in | Wt 206.2 lb

## 2012-11-08 DIAGNOSIS — C189 Malignant neoplasm of colon, unspecified: Secondary | ICD-10-CM

## 2012-11-08 DIAGNOSIS — Z5111 Encounter for antineoplastic chemotherapy: Secondary | ICD-10-CM

## 2012-11-08 DIAGNOSIS — C787 Secondary malignant neoplasm of liver and intrahepatic bile duct: Secondary | ICD-10-CM

## 2012-11-08 DIAGNOSIS — I10 Essential (primary) hypertension: Secondary | ICD-10-CM

## 2012-11-08 DIAGNOSIS — Z5112 Encounter for antineoplastic immunotherapy: Secondary | ICD-10-CM

## 2012-11-08 DIAGNOSIS — G5690 Unspecified mononeuropathy of unspecified upper limb: Secondary | ICD-10-CM

## 2012-11-08 DIAGNOSIS — C185 Malignant neoplasm of splenic flexure: Secondary | ICD-10-CM

## 2012-11-08 DIAGNOSIS — C7800 Secondary malignant neoplasm of unspecified lung: Secondary | ICD-10-CM

## 2012-11-08 LAB — COMPREHENSIVE METABOLIC PANEL (CC13)
ALT: 17 U/L (ref 0–55)
AST: 33 U/L (ref 5–34)
CO2: 23 mEq/L (ref 22–29)
Calcium: 9.3 mg/dL (ref 8.4–10.4)
Chloride: 106 mEq/L (ref 98–109)
Creatinine: 1.2 mg/dL (ref 0.7–1.3)
Glucose: 102 mg/dl (ref 70–140)
Sodium: 139 mEq/L (ref 136–145)
Total Protein: 7.5 g/dL (ref 6.4–8.3)

## 2012-11-08 LAB — CBC WITH DIFFERENTIAL/PLATELET
BASO%: 1.3 % (ref 0.0–2.0)
Basophils Absolute: 0.1 10*3/uL (ref 0.0–0.1)
Eosinophils Absolute: 0.1 10*3/uL (ref 0.0–0.5)
HCT: 36.6 % — ABNORMAL LOW (ref 38.4–49.9)
HGB: 11.9 g/dL — ABNORMAL LOW (ref 13.0–17.1)
MCHC: 32.6 g/dL (ref 32.0–36.0)
MCV: 88.2 fL (ref 79.3–98.0)
MONO#: 1 10*3/uL — ABNORMAL HIGH (ref 0.1–0.9)
MONO%: 23.6 % — ABNORMAL HIGH (ref 0.0–14.0)
NEUT#: 1.4 10*3/uL — ABNORMAL LOW (ref 1.5–6.5)
NEUT%: 33.6 % — ABNORMAL LOW (ref 39.0–75.0)
RBC: 4.16 10*6/uL — ABNORMAL LOW (ref 4.20–5.82)
WBC: 4.1 10*3/uL (ref 4.0–10.3)
lymph#: 1.7 10*3/uL (ref 0.9–3.3)

## 2012-11-08 LAB — UA PROTEIN, DIPSTICK - CHCC: Protein, ur: <30 mg/dL

## 2012-11-08 MED ORDER — LEUCOVORIN CALCIUM INJECTION 350 MG
400.0000 mg/m2 | Freq: Once | INTRAVENOUS | Status: AC
  Administered 2012-11-08: 868 mg via INTRAVENOUS
  Filled 2012-11-08: qty 43.4

## 2012-11-08 MED ORDER — FLUOROURACIL CHEMO INJECTION 2.5 GM/50ML
400.0000 mg/m2 | Freq: Once | INTRAVENOUS | Status: AC
  Administered 2012-11-08: 850 mg via INTRAVENOUS
  Filled 2012-11-08: qty 17

## 2012-11-08 MED ORDER — SODIUM CHLORIDE 0.9 % IV SOLN
5.0000 mg/kg | Freq: Once | INTRAVENOUS | Status: AC
  Administered 2012-11-08: 450 mg via INTRAVENOUS
  Filled 2012-11-08: qty 18

## 2012-11-08 MED ORDER — ONDANSETRON 8 MG/NS 50 ML IVPB
INTRAVENOUS | Status: AC
  Filled 2012-11-08: qty 8

## 2012-11-08 MED ORDER — DEXAMETHASONE SODIUM PHOSPHATE 10 MG/ML IJ SOLN
INTRAMUSCULAR | Status: AC
  Filled 2012-11-08: qty 1

## 2012-11-08 MED ORDER — OXALIPLATIN CHEMO INJECTION 100 MG/20ML
85.0000 mg/m2 | Freq: Once | INTRAVENOUS | Status: AC
  Administered 2012-11-08: 185 mg via INTRAVENOUS
  Filled 2012-11-08: qty 37

## 2012-11-08 MED ORDER — SODIUM CHLORIDE 0.9 % IV SOLN
Freq: Once | INTRAVENOUS | Status: AC
  Administered 2012-11-08: 14:00:00 via INTRAVENOUS

## 2012-11-08 MED ORDER — ONDANSETRON 8 MG/50ML IVPB (CHCC)
8.0000 mg | Freq: Once | INTRAVENOUS | Status: AC
  Administered 2012-11-08: 8 mg via INTRAVENOUS

## 2012-11-08 MED ORDER — DEXAMETHASONE SODIUM PHOSPHATE 10 MG/ML IJ SOLN
10.0000 mg | Freq: Once | INTRAMUSCULAR | Status: AC
  Administered 2012-11-08: 10 mg via INTRAVENOUS

## 2012-11-08 MED ORDER — DEXTROSE 5 % IV SOLN
Freq: Once | INTRAVENOUS | Status: AC
  Administered 2012-11-08: 15:00:00 via INTRAVENOUS

## 2012-11-08 MED ORDER — SODIUM CHLORIDE 0.9 % IV SOLN
2400.0000 mg/m2 | INTRAVENOUS | Status: DC
  Administered 2012-11-08: 5200 mg via INTRAVENOUS
  Filled 2012-11-08: qty 104

## 2012-11-08 NOTE — Progress Notes (Signed)
OK to treat with ANC 1.4 per Mardella Layman NP

## 2012-11-08 NOTE — Patient Instructions (Signed)
 Cancer Center Discharge Instructions for Patients Receiving Chemotherapy  Today you received the following chemotherapy agents OXALIPLATIN, LEUCOVORIN, 5FU, 5FU PUMP  To help prevent nausea and vomiting after your treatment, we encourage you to take your nausea medication IF NEEDED   If you develop nausea and vomiting that is not controlled by your nausea medication, call the clinic.   BELOW ARE SYMPTOMS THAT SHOULD BE REPORTED IMMEDIATELY:  *FEVER GREATER THAN 100.5 F  *CHILLS WITH OR WITHOUT FEVER  NAUSEA AND VOMITING THAT IS NOT CONTROLLED WITH YOUR NAUSEA MEDICATION  *UNUSUAL SHORTNESS OF BREATH  *UNUSUAL BRUISING OR BLEEDING  TENDERNESS IN MOUTH AND THROAT WITH OR WITHOUT PRESENCE OF ULCERS  *URINARY PROBLEMS  *BOWEL PROBLEMS  UNUSUAL RASH Items with * indicate a potential emergency and should be followed up as soon as possible.  Feel free to call the clinic you have any questions or concerns. The clinic phone number is [phone].

## 2012-11-08 NOTE — Patient Instructions (Signed)
Take Gabapentin 200mg  three times per day.  Proceed with chemotherapy.  Please call us if you have any questions or concerns.

## 2012-11-08 NOTE — Progress Notes (Addendum)
OFFICE PROGRESS NOTE  CC**  No PCP Per Patient [address] [place] [address] Dr. Gaylord Shih  DIAGNOSIS: 51 year old gentleman with metastatic colon carcinoma who originally was diagnosed in [place] and use [place]   PRIOR THERAPY: #17 January 2012: when patient developed weeks of mild abdominal pain or decreased appetite nausea vomiting. He had his CT in the ER that revealed inflammatory: Mass at the splenic flexure metastasis were obvious liver no white neuropathy. December 2013: he was taken to the operating room for a large bowel obstruction under went partial colectomy with site to.side functional and N. To end staple anastomosis. Pathology revealed invasive adenocarcinoma low grade 6.0 cm tumor no lymphovascular invasion or perineural invasion. 0 of 15 lymph nodes were positive margins were negative. Pathologic stage TIII N0 DNA MMR staining by immunohistochemistry was intact K.RAS mutation not found at exon 1 &2 (including codone 12, 13, 14, 61)  #18 February 2012 CEA level was elevated at 120.  February 2014: CT chest abdomen pelvis: Multiple bilateral lung masses at least 5 lesions in each lung, 2 cm lesion noted in segment of liver, multiple small mesenteric lymph nodes which were stable and not pathologically enlarged no bony lesions.  February 2014 right liver biopsy of segment 5: Metastatic adenocarcinoma consistent with colon origin, positive for CK 20 CDX2 and villin negative for PSA TTF-1 and CK 7    #21 March 2012: Started on modified FOLFOX 6+ Avastin chemotherapy  April 2014 CEA level 35 after third cycle of treatment CT chest abdomen pelvis: Stable bilateral subcentimeter pulmonary nodules mild decrease in left lower lobe nodule decrease in size of lesion within the right hepatic lobe no new lesions seen  May 2014 CEA level 11.22 July 2012: completed eighth cycle of treatment with modified FOLFOX 6+ Avastin chemotherapy    CURRENT THERAPY:  Here for  Cycle 11 of modified FOLFOX/Avastin with day 4 Neulasta  INTERVAL HISTORY: Christopher Fuentes 52 y.o. male returns for followup visit today prior to cycle #11 of FOLFOX. Overall he is doing remarkably well. 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  He is taking the Neurontin TID.  He continues to have intermittent numbness in his fingertips and in the toes of his right foot.  He has retained his motor function of his fingers and feet.  He is eating and drinking well and is otherwise denies fevers, chills, nausea, vomiting, constipation, diarrhea or further concerns.    MEDICAL HISTORY: Past Medical History  Diagnosis Date  . Colon cancer 01/2012    stage IV   . 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  MEDICATIONS:  Current Outpatient Prescriptions  Medication Sig Dispense Refill  . gabapentin (NEURONTIN) 100 MG capsule Take 1 capsule (100 mg total) by mouth 3 (three) times daily.  90 capsule  6  . losartan-hydrochlorothiazide (HYZAAR) 100-25 MG per tablet Take by mouth daily.      . ondansetron (ZOFRAN) 8 MG tablet Take 1 tablet (8 mg total) by mouth 2 (two) times daily. Take two times a day starting the day after chemo for 2 days. Then take two times a day as needed for nausea or vomiting.  30 tablet  1  . prochlorperazine (COMPAZINE) 10 MG tablet Take 1 tablet (10 mg total) by mouth every 6 (six) hours as needed (Nausea or vomiting).  30 tablet  1  . cephALEXin (KEFLEX) 500 MG capsule Take 1 capsule (500 mg total) by mouth 4 (four) times daily.  28 capsule  1  . dexamethasone (DECADRON) 4 MG tablet Take 2 tablets (8 mg total) by mouth 2 (two) times daily with a meal. Take daily starting the day after chemotherapy for 2 days. Take with food.  30 tablet  1  . KLOR-CON M20 20 MEQ tablet Take 20 mEq by mouth daily.      Marland Kitchen LORazepam (ATIVAN) 0.5 MG tablet Take 1 tablet (0.5 mg  total) by mouth every 6 (six) hours as needed (Nausea or vomiting).  30 tablet  0   No current facility-administered medications for this visit.    SURGICAL HISTORY: No past surgical history on file.  REVIEW OF SYSTEMS:  A 10 point review of systems was conducted and is otherwise negative except for what is noted above.    PHYSICAL EXAMINATION: Blood pressure 135/81, pulse 80, temperature 98.6 F (37 C), temperature source Oral, resp. rate 20, height 6\' 1"  (1.854 m), weight 206 lb 3.2 oz (93.532 kg). Body mass index is 27.21 kg/(m^2). General: Patient is a well appearing male in no acute distress HEENT: PERRLA, sclerae anicteric no conjunctival pallor, MMM Neck: supple, no palpable adenopathy Lungs: clear to auscultation bilaterally, no wheezes, rhonchi, or rales Cardiovascular: regular rate rhythm, S1, S2, no murmurs, rubs or gallops Abdomen: Soft, non-tender, non-distended, normoactive bowel sounds, no HSM Extremities: warm and well perfused, no clubbing, cyanosis, or edema Skin: No rashes or lesions Neuro: Non-focal ECOG PERFORMANCE STATUS: 0 - Asymptomatic      LABORATORY DATA: Lab Results  Component Value Date   WBC 4.1 11/08/2012   HGB 11.9* 11/08/2012   HCT 36.6* 11/08/2012   MCV 88.2 11/08/2012   PLT 187 11/08/2012      Chemistry      Component Value Date/Time   NA 139 11/08/2012 1217   K 3.7 11/08/2012 1217   CO2 23 11/08/2012 1217   BUN 15.5 11/08/2012 1217   CREATININE 1.2 11/08/2012 1217      Component Value Date/Time   CALCIUM 9.3 11/08/2012 1217   ALKPHOS 85 11/08/2012 1217   AST 33 11/08/2012 1217   ALT 17 11/08/2012 1217   BILITOT 0.39 11/08/2012 1217       RADIOGRAPHIC STUDIES:  Dg Chest 2 View  09/15/2012   *RADIOLOGY REPORT*  Clinical Data: Port-A-Cath placement.  CHEST - 2 VIEW  Comparison: No priors.  Findings: Left-sided subclavian Port-A-Cath with tip terminating in the distal superior vena cava. Lung volumes are normal.  No consolidative airspace  disease.  No pleural effusions.  No pneumothorax.  No pulmonary nodule or mass noted.  Pulmonary vasculature and the cardiomediastinal silhouette are within normal limits.  IMPRESSION: 1. No radiographic evidence of acute cardiopulmonary disease. 2.  Tip of Port-A-Cath in the distal superior vena cava.   Original Report Authenticated By: Trudie Reed, M.D.    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 Patient is receiving palliative chemotherapy with modified FOLFOX 6 and Avastin. He is now here for cycle #11 of his treatment.  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  #3 neuropathy: Doing well on Neurontin   PLAN:   #1 patient will proceed with cycle 11 of FOLFOX/Avastin with day 4 Neulasta today. He will return in 3 days for Neulasta and DC pump.  #2 neuropathy is unchanged, he will increase Neurontin to 200mg   TID.    #3 he'll return on 11/18/12 time for followup and cycle 12 of chemotherapy   All questions were answered. The patient knows to call the clinic with any problems, questions or concerns. We can certainly see the patient much sooner if necessary.  I spent 25 minutes counseling the patient face to face. The total time spent in the appointment was 25 minutes.  Cherie Ouch Lyn Hollingshead, NP Medical Oncology [facility] Phone: [phone]  ATTENDING'S ATTESTATION:  I personally reviewed patient's chart, examined patient myself, formulated the treatment plan as followed.    Overall patient is doing well he is now due for cycle 11 of FOLFOX Avastin. He also does receive Neulasta. Patient is developing neuropathy he is on Neurontin and we will increase it to 200 mg 3 times a day. He is planning on having total. 12 cycles of FOLFOX Avastin. Thereafter I do think he will need maintenance treatment with Avastin and possibly 5-FU and leucovorin. I will discuss this further  with him when he returns after 12 cycles of his treatments. Overall patient is feeling well he is very pleased with his care.  Drue Second, MD Medical/Oncology [facility] [phone] (beeper) [phone] (Office)  11/16/2012, 1:07 PM

## 2012-11-10 ENCOUNTER — Other Ambulatory Visit: Payer: Self-pay | Admitting: Emergency Medicine

## 2012-11-10 ENCOUNTER — Ambulatory Visit: Payer: 59

## 2012-11-10 ENCOUNTER — Telehealth: Payer: Self-pay | Admitting: Oncology

## 2012-11-10 ENCOUNTER — Ambulatory Visit (HOSPITAL_BASED_OUTPATIENT_CLINIC_OR_DEPARTMENT_OTHER): Payer: 59

## 2012-11-10 VITALS — BP 137/99 | HR 74 | Temp 98.6°F | Resp 18

## 2012-11-10 DIAGNOSIS — C7800 Secondary malignant neoplasm of unspecified lung: Secondary | ICD-10-CM

## 2012-11-10 DIAGNOSIS — C185 Malignant neoplasm of splenic flexure: Secondary | ICD-10-CM

## 2012-11-10 DIAGNOSIS — C787 Secondary malignant neoplasm of liver and intrahepatic bile duct: Secondary | ICD-10-CM

## 2012-11-10 DIAGNOSIS — C189 Malignant neoplasm of colon, unspecified: Secondary | ICD-10-CM

## 2012-11-10 MED ORDER — PEGFILGRASTIM INJECTION 6 MG/0.6ML
6.0000 mg | Freq: Once | SUBCUTANEOUS | Status: AC
  Administered 2012-11-10: 6 mg via SUBCUTANEOUS
  Filled 2012-11-10: qty 0.6

## 2012-11-10 MED ORDER — HEPARIN SOD (PORK) LOCK FLUSH 100 UNIT/ML IV SOLN
500.0000 [IU] | Freq: Once | INTRAVENOUS | Status: AC | PRN
  Administered 2012-11-10: 500 [IU]
  Filled 2012-11-10: qty 5

## 2012-11-10 MED ORDER — SODIUM CHLORIDE 0.9 % IJ SOLN
10.0000 mL | INTRAMUSCULAR | Status: DC | PRN
  Administered 2012-11-10: 10 mL
  Filled 2012-11-10: qty 10

## 2012-11-10 NOTE — Progress Notes (Signed)
Injection given in flush room  

## 2012-11-11 ENCOUNTER — Ambulatory Visit: Payer: 59 | Admitting: Oncology

## 2012-11-11 ENCOUNTER — Other Ambulatory Visit: Payer: 59 | Admitting: Lab

## 2012-11-11 ENCOUNTER — Ambulatory Visit: Payer: 59

## 2012-11-18 ENCOUNTER — Ambulatory Visit (HOSPITAL_BASED_OUTPATIENT_CLINIC_OR_DEPARTMENT_OTHER): Payer: 59

## 2012-11-18 ENCOUNTER — Encounter: Payer: Self-pay | Admitting: Oncology

## 2012-11-18 ENCOUNTER — Ambulatory Visit (HOSPITAL_BASED_OUTPATIENT_CLINIC_OR_DEPARTMENT_OTHER): Payer: 59 | Admitting: Oncology

## 2012-11-18 ENCOUNTER — Other Ambulatory Visit (HOSPITAL_BASED_OUTPATIENT_CLINIC_OR_DEPARTMENT_OTHER): Payer: 59 | Admitting: Lab

## 2012-11-18 VITALS — BP 138/87 | HR 77 | Temp 98.0°F | Resp 20 | Ht 73.0 in | Wt 204.6 lb

## 2012-11-18 DIAGNOSIS — G609 Hereditary and idiopathic neuropathy, unspecified: Secondary | ICD-10-CM

## 2012-11-18 DIAGNOSIS — C189 Malignant neoplasm of colon, unspecified: Secondary | ICD-10-CM

## 2012-11-18 DIAGNOSIS — C787 Secondary malignant neoplasm of liver and intrahepatic bile duct: Secondary | ICD-10-CM

## 2012-11-18 DIAGNOSIS — Z5111 Encounter for antineoplastic chemotherapy: Secondary | ICD-10-CM

## 2012-11-18 DIAGNOSIS — Z5112 Encounter for antineoplastic immunotherapy: Secondary | ICD-10-CM

## 2012-11-18 DIAGNOSIS — I10 Essential (primary) hypertension: Secondary | ICD-10-CM

## 2012-11-18 DIAGNOSIS — C7800 Secondary malignant neoplasm of unspecified lung: Secondary | ICD-10-CM

## 2012-11-18 HISTORY — DX: Essential (primary) hypertension: I10

## 2012-11-18 LAB — CBC WITH DIFFERENTIAL/PLATELET
Basophils Absolute: 0.1 10*3/uL (ref 0.0–0.1)
EOS%: 0.7 % (ref 0.0–7.0)
MCH: 28.6 pg (ref 27.2–33.4)
MCV: 88.2 fL (ref 79.3–98.0)
MONO%: 14 % (ref 0.0–14.0)
RBC: 4.25 10*6/uL (ref 4.20–5.82)
RDW: 15.8 % — ABNORMAL HIGH (ref 11.0–14.6)
WBC: 8.1 10*3/uL (ref 4.0–10.3)

## 2012-11-18 LAB — COMPREHENSIVE METABOLIC PANEL (CC13)
AST: 28 U/L (ref 5–34)
Albumin: 3.4 g/dL — ABNORMAL LOW (ref 3.5–5.0)
Alkaline Phosphatase: 148 U/L (ref 40–150)
BUN: 13.4 mg/dL (ref 7.0–26.0)
Glucose: 117 mg/dl (ref 70–140)
Potassium: 3.6 mEq/L (ref 3.5–5.1)
Sodium: 141 mEq/L (ref 136–145)

## 2012-11-18 MED ORDER — HEPARIN SOD (PORK) LOCK FLUSH 100 UNIT/ML IV SOLN
500.0000 [IU] | Freq: Once | INTRAVENOUS | Status: DC | PRN
  Filled 2012-11-18: qty 5

## 2012-11-18 MED ORDER — LEUCOVORIN CALCIUM INJECTION 350 MG
400.0000 mg/m2 | Freq: Once | INTRAVENOUS | Status: AC
  Administered 2012-11-18: 868 mg via INTRAVENOUS
  Filled 2012-11-18: qty 43.4

## 2012-11-18 MED ORDER — SODIUM CHLORIDE 0.9 % IV SOLN
5.0000 mg/kg | Freq: Once | INTRAVENOUS | Status: AC
  Administered 2012-11-18: 450 mg via INTRAVENOUS
  Filled 2012-11-18: qty 18

## 2012-11-18 MED ORDER — SODIUM CHLORIDE 0.9 % IJ SOLN
10.0000 mL | INTRAMUSCULAR | Status: DC | PRN
  Filled 2012-11-18: qty 10

## 2012-11-18 MED ORDER — DEXAMETHASONE SODIUM PHOSPHATE 10 MG/ML IJ SOLN
INTRAMUSCULAR | Status: AC
  Filled 2012-11-18: qty 1

## 2012-11-18 MED ORDER — ONDANSETRON 8 MG/NS 50 ML IVPB
INTRAVENOUS | Status: AC
  Filled 2012-11-18: qty 8

## 2012-11-18 MED ORDER — ONDANSETRON 8 MG/50ML IVPB (CHCC)
8.0000 mg | Freq: Once | INTRAVENOUS | Status: AC
  Administered 2012-11-18: 8 mg via INTRAVENOUS

## 2012-11-18 MED ORDER — DEXAMETHASONE SODIUM PHOSPHATE 10 MG/ML IJ SOLN
10.0000 mg | Freq: Once | INTRAMUSCULAR | Status: AC
  Administered 2012-11-18: 10 mg via INTRAVENOUS

## 2012-11-18 MED ORDER — OXALIPLATIN CHEMO INJECTION 100 MG/20ML
85.0000 mg/m2 | Freq: Once | INTRAVENOUS | Status: AC
  Administered 2012-11-18: 185 mg via INTRAVENOUS
  Filled 2012-11-18: qty 37

## 2012-11-18 MED ORDER — SODIUM CHLORIDE 0.9 % IV SOLN
2400.0000 mg/m2 | INTRAVENOUS | Status: DC
  Administered 2012-11-18: 5200 mg via INTRAVENOUS
  Filled 2012-11-18: qty 104

## 2012-11-18 MED ORDER — DEXTROSE 5 % IV SOLN
Freq: Once | INTRAVENOUS | Status: AC
  Administered 2012-11-18: 11:00:00 via INTRAVENOUS

## 2012-11-18 MED ORDER — FLUOROURACIL CHEMO INJECTION 2.5 GM/50ML
400.0000 mg/m2 | Freq: Once | INTRAVENOUS | Status: AC
  Administered 2012-11-18: 850 mg via INTRAVENOUS
  Filled 2012-11-18: qty 17

## 2012-11-18 MED ORDER — SODIUM CHLORIDE 0.9 % IV SOLN
Freq: Once | INTRAVENOUS | Status: AC
  Administered 2012-11-18: 10:00:00 via INTRAVENOUS

## 2012-11-18 MED ORDER — LOSARTAN POTASSIUM-HCTZ 100-25 MG PO TABS: 1.0000 | ORAL_TABLET | Freq: Every day | ORAL | Status: DC

## 2012-11-18 NOTE — Patient Instructions (Signed)
[facility] Discharge Instructions for Patients Receiving Chemotherapy  Today you received the following chemotherapy agents:  Oxaliplatin, Leucovorin, 5FU and Avastin  To help prevent nausea and vomiting after your treatment, we encourage you to take your nausea medication as ordered per MD.   If you develop nausea and vomiting that is not controlled by your nausea medication, call the clinic.   BELOW ARE SYMPTOMS THAT SHOULD BE REPORTED IMMEDIATELY:  *FEVER GREATER THAN 100.5 F  *CHILLS WITH OR WITHOUT FEVER  NAUSEA AND VOMITING THAT IS NOT CONTROLLED WITH YOUR NAUSEA MEDICATION  *UNUSUAL SHORTNESS OF BREATH  *UNUSUAL BRUISING OR BLEEDING  TENDERNESS IN MOUTH AND THROAT WITH OR WITHOUT PRESENCE OF ULCERS  *URINARY PROBLEMS  *BOWEL PROBLEMS  UNUSUAL RASH Items with * indicate a potential emergency and should be followed up as soon as possible.  Feel free to call the clinic you have any questions or concerns. The clinic phone number is [phone].    

## 2012-11-18 NOTE — Progress Notes (Signed)
OFFICE PROGRESS NOTE  CC**  No PCP Per Patient [address] [place] [address] Dr. Gaylord Shih  DIAGNOSIS: 53 year old gentleman with metastatic colon carcinoma who originally was diagnosed in [place] and use [place]   PRIOR THERAPY: #17 January 2012: when patient developed weeks of mild abdominal pain or decreased appetite nausea vomiting. He had his CT in the ER that revealed inflammatory: Mass at the splenic flexure metastasis were obvious liver no white neuropathy. December 2013: he was taken to the operating room for a large bowel obstruction under went partial colectomy with site to.side functional and N. To end staple anastomosis. Pathology revealed invasive adenocarcinoma low grade 6.0 cm tumor no lymphovascular invasion or perineural invasion. 0 of 15 lymph nodes were positive margins were negative. Pathologic stage TIII N0 DNA MMR staining by immunohistochemistry was intact K.RAS mutation not found at exon 1 &2 (including codone 12, 13, 14, 61)  #18 February 2012 CEA level was elevated at 120.  February 2014: CT chest abdomen pelvis: Multiple bilateral lung masses at least 5 lesions in each lung, 2 cm lesion noted in segment of liver, multiple small mesenteric lymph nodes which were stable and not pathologically enlarged no bony lesions.  February 2014 right liver biopsy of segment 5: Metastatic adenocarcinoma consistent with colon origin, positive for CK 20 CDX2 and villin negative for PSA TTF-1 and CK 7    #21 March 2012: Started on modified FOLFOX 6+ Avastin chemotherapy  April 2014 CEA level 35 after third cycle of treatment CT chest abdomen pelvis: Stable bilateral subcentimeter pulmonary nodules mild decrease in left lower lobe nodule decrease in size of lesion within the right hepatic lobe no new lesions seen  May 2014 CEA level 11.22 July 2012: completed eighth cycle of treatment with modified FOLFOX 6+ Avastin chemotherapy    CURRENT THERAPY:   Cycle 11 of  modified FOLFOX/Avastin with day 4 Neulasta  INTERVAL HISTORY: Christopher Fuentes 52 y.o. male returns for followup visit today prior to cycle #11 of FOLFOX. Overall he is doing remarkably well.he is feeling much better with regards aches and pains, he also has less neuropathy in the figers.  He is taking the Neurontin TID.    He is eating and drinking well and is otherwise denies fevers, chills, nausea, vomiting, constipation, diarrhea or further concerns.  Remainder of the 10 point review of systems is negative.  MEDICAL HISTORY: Past Medical History  Diagnosis Date  . Colon cancer 01/2012    stage IV   . 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 Hypertension 11/18/2012    ALLERGIES:  is allergic to other.  MEDICATIONS:  Current Outpatient Prescriptions  Medication Sig Dispense Refill  . cephALEXin (KEFLEX) 500 MG capsule Take 1 capsule (500 mg total) by mouth 4 (four) times daily.  28 capsule  1  . dexamethasone (DECADRON) 4 MG tablet Take 2 tablets (8 mg total) by mouth 2 (two) times daily with a meal. Take daily starting the day after chemotherapy for 2 days. Take with food.  30 tablet  1  . gabapentin (NEURONTIN) 100 MG capsule Take 1 capsule (100 mg total) by mouth 3 (three) times daily.  90 capsule  6  . KLOR-CON M20 20 MEQ tablet Take 20 mEq by mouth daily.      Marland Kitchen LORazepam (ATIVAN) 0.5 MG tablet Take 1 tablet (0.5 mg total) by mouth every 6 (six) hours as needed (Nausea or vomiting).  30 tablet  0  .  losartan-hydrochlorothiazide (HYZAAR) 100-25 MG per tablet Take by mouth daily.      . ondansetron (ZOFRAN) 8 MG tablet Take 1 tablet (8 mg total) by mouth 2 (two) times daily. Take two times a day starting the day after chemo for 2 days. Then take two times a day as needed for nausea or vomiting.  30 tablet  1  . prochlorperazine (COMPAZINE) 10 MG tablet Take 1 tablet (10 mg total) by mouth every 6 (six) hours as needed (Nausea or vomiting).  30 tablet  1   No current  facility-administered medications for this visit.    SURGICAL HISTORY: History reviewed. No pertinent past surgical history.  REVIEW OF SYSTEMS:  A 10 point review of systems was conducted and is otherwise negative except for what is noted above.    PHYSICAL EXAMINATION: Blood pressure 138/87, pulse 77, temperature 98 F (36.7 C), temperature source Oral, resp. rate 20, height 6\' 1"  (1.854 m), weight 204 lb 9.6 oz (92.806 kg). Body mass index is 27 kg/(m^2). General: Patient is a well appearing male in no acute distress HEENT: PERRLA, sclerae anicteric no conjunctival pallor, MMM Neck: supple, no palpable adenopathy Lungs: clear to auscultation bilaterally, no wheezes, rhonchi, or rales Cardiovascular: regular rate rhythm, S1, S2, no murmurs, rubs or gallops Abdomen: Soft, non-tender, non-distended, normoactive bowel sounds, no HSM Extremities: warm and well perfused, no clubbing, cyanosis, or edema Skin: No rashes or lesions Neuro: Non-focal ECOG PERFORMANCE STATUS: 0 - Asymptomatic      LABORATORY DATA: Lab Results  Component Value Date   WBC 8.1 11/18/2012   HGB 12.2* 11/18/2012   HCT 37.5* 11/18/2012   MCV 88.2 11/18/2012   PLT 182 11/18/2012      Chemistry      Component Value Date/Time   NA 141 11/18/2012 0808   K 3.6 11/18/2012 0808   CO2 24 11/18/2012 0808   BUN 13.4 11/18/2012 0808   CREATININE 1.2 11/18/2012 0808      Component Value Date/Time   CALCIUM 9.6 11/18/2012 0808   ALKPHOS 148 11/18/2012 0808   AST 28 11/18/2012 0808   ALT 15 11/18/2012 0808   BILITOT 0.23 11/18/2012 0808       RADIOGRAPHIC STUDIES:  Dg Chest 2 View  09/15/2012   *RADIOLOGY REPORT*  Clinical Data: Port-A-Cath placement.  CHEST - 2 VIEW  Comparison: No priors.  Findings: Left-sided subclavian Port-A-Cath with tip terminating in the distal superior vena cava. Lung volumes are normal.  No consolidative airspace disease.  No pleural effusions.  No pneumothorax.  No pulmonary nodule or mass  noted.  Pulmonary vasculature and the cardiomediastinal silhouette are within normal limits.  IMPRESSION: 1. No radiographic evidence of acute cardiopulmonary disease. 2.  Tip of Port-A-Cath in the distal superior vena cava.   Original Report Authenticated By: Trudie Reed, M.D.    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 Patient is receiving palliative chemotherapy with modified FOLFOX 6 and Avastin. He is now here for cycle #11 of his treatment.  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  #3 neuropathy: Doing well on Neurontin   PLAN:   #1 patient will proceed with cycle 11 of FOLFOX/Avastin with day 4 Neulasta today. He will return in 3 days for Neulasta and DC pump.  #2 neuropathy is unchanged, he will increase Neurontin to 200mg  TID.    #3 he'll return on 12/02/12 time for followup and cycle 12 of chemotherapy  #  4 we will plan on restaging patient with scans to determine response to therapy. We will plan on having continue avastin maintenance and add possible capecitabine or 5FU/Leucovorin.   All questions were answered. The patient knows to call the clinic with any problems, questions or concerns. We can certainly see the patient much sooner if necessary.  I spent 25 minutes counseling the patient face to face. The total time spent in the appointment was 25 minutes.  Drue Second, MD Medical/Oncology [facility] [phone] (beeper) [phone] (Office)  11/18/2012, 8:54 AM

## 2012-11-18 NOTE — Progress Notes (Signed)
Per Dr. Welton Flakes, Treat with Folfox/ Avastin today at a 10 day interval.

## 2012-11-18 NOTE — Patient Instructions (Addendum)
Proceed with scheduled chemotherapy today  We will see you back in 2 weeks for follow up and cycle 12 of FOLFOX/avastin

## 2012-11-21 ENCOUNTER — Ambulatory Visit (HOSPITAL_BASED_OUTPATIENT_CLINIC_OR_DEPARTMENT_OTHER): Payer: 59

## 2012-11-21 VITALS — BP 140/90 | HR 77 | Temp 98.0°F

## 2012-11-21 DIAGNOSIS — C185 Malignant neoplasm of splenic flexure: Secondary | ICD-10-CM

## 2012-11-21 DIAGNOSIS — C7800 Secondary malignant neoplasm of unspecified lung: Secondary | ICD-10-CM

## 2012-11-21 DIAGNOSIS — Z5189 Encounter for other specified aftercare: Secondary | ICD-10-CM

## 2012-11-21 DIAGNOSIS — C189 Malignant neoplasm of colon, unspecified: Secondary | ICD-10-CM

## 2012-11-21 MED ORDER — HEPARIN SOD (PORK) LOCK FLUSH 100 UNIT/ML IV SOLN
500.0000 [IU] | Freq: Once | INTRAVENOUS | Status: AC | PRN
  Administered 2012-11-21: 500 [IU]
  Filled 2012-11-21: qty 5

## 2012-11-21 MED ORDER — PEGFILGRASTIM INJECTION 6 MG/0.6ML
6.0000 mg | Freq: Once | SUBCUTANEOUS | Status: AC
  Administered 2012-11-21: 6 mg via SUBCUTANEOUS
  Filled 2012-11-21: qty 0.6

## 2012-11-21 MED ORDER — SODIUM CHLORIDE 0.9 % IJ SOLN
10.0000 mL | INTRAMUSCULAR | Status: DC | PRN
  Administered 2012-11-21: 10 mL
  Filled 2012-11-21: qty 10

## 2012-11-25 ENCOUNTER — Other Ambulatory Visit: Payer: Self-pay | Admitting: Emergency Medicine

## 2012-11-25 MED ORDER — FIRST-DUKES MOUTHWASH MT SUSP: 5.0000 mL | Freq: Four times a day (QID) | OROMUCOSAL | Status: DC | PRN

## 2012-11-25 MED ORDER — VALACYCLOVIR HCL 500 MG PO TABS: 500.0000 mg | ORAL_TABLET | Freq: Two times a day (BID) | ORAL | Status: DC

## 2012-12-02 ENCOUNTER — Ambulatory Visit: Payer: 59 | Admitting: Oncology

## 2012-12-02 ENCOUNTER — Encounter (INDEPENDENT_AMBULATORY_CARE_PROVIDER_SITE_OTHER): Payer: Self-pay

## 2012-12-02 ENCOUNTER — Other Ambulatory Visit (HOSPITAL_BASED_OUTPATIENT_CLINIC_OR_DEPARTMENT_OTHER): Payer: 59 | Admitting: Lab

## 2012-12-02 ENCOUNTER — Ambulatory Visit (HOSPITAL_BASED_OUTPATIENT_CLINIC_OR_DEPARTMENT_OTHER): Payer: 59

## 2012-12-02 VITALS — BP 113/71 | HR 56 | Temp 97.8°F | Resp 18

## 2012-12-02 DIAGNOSIS — C787 Secondary malignant neoplasm of liver and intrahepatic bile duct: Secondary | ICD-10-CM

## 2012-12-02 DIAGNOSIS — Z5112 Encounter for antineoplastic immunotherapy: Secondary | ICD-10-CM

## 2012-12-02 DIAGNOSIS — Z5111 Encounter for antineoplastic chemotherapy: Secondary | ICD-10-CM

## 2012-12-02 DIAGNOSIS — C189 Malignant neoplasm of colon, unspecified: Secondary | ICD-10-CM

## 2012-12-02 LAB — CBC WITH DIFFERENTIAL/PLATELET
BASO%: 0.5 % (ref 0.0–2.0)
EOS%: 2.1 % (ref 0.0–7.0)
HCT: 37.6 % — ABNORMAL LOW (ref 38.4–49.9)
LYMPH%: 29.3 % (ref 14.0–49.0)
MCH: 28.4 pg (ref 27.2–33.4)
MCHC: 33 g/dL (ref 32.0–36.0)
MCV: 86 fL (ref 79.3–98.0)
MONO#: 1.1 10*3/uL — ABNORMAL HIGH (ref 0.1–0.9)
MONO%: 17.6 % — ABNORMAL HIGH (ref 0.0–14.0)
NEUT%: 50.5 % (ref 39.0–75.0)
Platelets: ADEQUATE 10*3/uL (ref 140–400)
WBC: 6.3 10*3/uL (ref 4.0–10.3)

## 2012-12-02 LAB — COMPREHENSIVE METABOLIC PANEL (CC13)
AST: 31 U/L (ref 5–34)
Alkaline Phosphatase: 123 U/L (ref 40–150)
Anion Gap: 10 mEq/L (ref 3–11)
BUN: 21.3 mg/dL (ref 7.0–26.0)
Calcium: 9.8 mg/dL (ref 8.4–10.4)
Chloride: 101 mEq/L (ref 98–109)
Creatinine: 1.6 mg/dL — ABNORMAL HIGH (ref 0.7–1.3)
Total Bilirubin: 0.32 mg/dL (ref 0.20–1.20)
Total Protein: 8.2 g/dL (ref 6.4–8.3)

## 2012-12-02 MED ORDER — FLUOROURACIL CHEMO INJECTION 2.5 GM/50ML
400.0000 mg/m2 | Freq: Once | INTRAVENOUS | Status: AC
  Administered 2012-12-02: 850 mg via INTRAVENOUS
  Filled 2012-12-02: qty 17

## 2012-12-02 MED ORDER — DEXAMETHASONE SODIUM PHOSPHATE 10 MG/ML IJ SOLN
INTRAMUSCULAR | Status: AC
  Filled 2012-12-02: qty 1

## 2012-12-02 MED ORDER — ONDANSETRON 8 MG/NS 50 ML IVPB
INTRAVENOUS | Status: AC
  Filled 2012-12-02: qty 8

## 2012-12-02 MED ORDER — SODIUM CHLORIDE 0.9 % IV SOLN
2400.0000 mg/m2 | INTRAVENOUS | Status: DC
  Administered 2012-12-02: 5200 mg via INTRAVENOUS
  Filled 2012-12-02: qty 104

## 2012-12-02 MED ORDER — DEXAMETHASONE SODIUM PHOSPHATE 10 MG/ML IJ SOLN
10.0000 mg | Freq: Once | INTRAMUSCULAR | Status: AC
  Administered 2012-12-02: 10 mg via INTRAVENOUS

## 2012-12-02 MED ORDER — DEXTROSE 5 % IV SOLN
Freq: Once | INTRAVENOUS | Status: AC
  Administered 2012-12-02: 11:00:00 via INTRAVENOUS

## 2012-12-02 MED ORDER — SODIUM CHLORIDE 0.9 % IV SOLN
5.0000 mg/kg | Freq: Once | INTRAVENOUS | Status: AC
  Administered 2012-12-02: 450 mg via INTRAVENOUS
  Filled 2012-12-02: qty 18

## 2012-12-02 MED ORDER — LEUCOVORIN CALCIUM INJECTION 350 MG
400.0000 mg/m2 | Freq: Once | INTRAVENOUS | Status: AC
  Administered 2012-12-02: 868 mg via INTRAVENOUS
  Filled 2012-12-02: qty 43.4

## 2012-12-02 MED ORDER — ONDANSETRON 8 MG/50ML IVPB (CHCC)
8.0000 mg | Freq: Once | INTRAVENOUS | Status: AC
  Administered 2012-12-02: 8 mg via INTRAVENOUS

## 2012-12-02 MED ORDER — OXALIPLATIN CHEMO INJECTION 100 MG/20ML
85.0000 mg/m2 | Freq: Once | INTRAVENOUS | Status: AC
  Administered 2012-12-02: 185 mg via INTRAVENOUS
  Filled 2012-12-02: qty 37

## 2012-12-05 ENCOUNTER — Encounter (INDEPENDENT_AMBULATORY_CARE_PROVIDER_SITE_OTHER): Payer: Self-pay

## 2012-12-05 ENCOUNTER — Telehealth: Payer: Self-pay | Admitting: Oncology

## 2012-12-05 ENCOUNTER — Ambulatory Visit (HOSPITAL_BASED_OUTPATIENT_CLINIC_OR_DEPARTMENT_OTHER): Payer: 59

## 2012-12-05 VITALS — BP 127/85 | HR 100 | Temp 98.3°F

## 2012-12-05 DIAGNOSIS — C787 Secondary malignant neoplasm of liver and intrahepatic bile duct: Secondary | ICD-10-CM

## 2012-12-05 DIAGNOSIS — C189 Malignant neoplasm of colon, unspecified: Secondary | ICD-10-CM

## 2012-12-05 DIAGNOSIS — Z5189 Encounter for other specified aftercare: Secondary | ICD-10-CM

## 2012-12-05 MED ORDER — PEGFILGRASTIM INJECTION 6 MG/0.6ML
6.0000 mg | Freq: Once | SUBCUTANEOUS | Status: AC
  Administered 2012-12-05: 6 mg via SUBCUTANEOUS
  Filled 2012-12-05: qty 0.6

## 2012-12-05 MED ORDER — SODIUM CHLORIDE 0.9 % IJ SOLN
10.0000 mL | INTRAMUSCULAR | Status: DC | PRN
  Administered 2012-12-05: 10 mL
  Filled 2012-12-05: qty 10

## 2012-12-05 MED ORDER — HEPARIN SOD (PORK) LOCK FLUSH 100 UNIT/ML IV SOLN
500.0000 [IU] | Freq: Once | INTRAVENOUS | Status: AC | PRN
  Administered 2012-12-05: 500 [IU]
  Filled 2012-12-05: qty 5

## 2012-12-05 NOTE — Telephone Encounter (Signed)
S/w the pt and he is aware of his lab and md appts on 12/09/2012@10 :30am

## 2012-12-05 NOTE — Progress Notes (Signed)
Ok to D/C pump with 21 mL left to infuse per Dr. Darnelle Catalan.   Patient complains of increased neuropathy in hands and feet. Dr. Milta Deiters desk nurse notified.

## 2012-12-05 NOTE — Patient Instructions (Signed)

## 2012-12-06 ENCOUNTER — Other Ambulatory Visit: Payer: Self-pay | Admitting: Certified Registered Nurse Anesthetist

## 2012-12-07 ENCOUNTER — Telehealth: Payer: Self-pay | Admitting: *Deleted

## 2012-12-07 NOTE — Telephone Encounter (Signed)
Pt called lmovm with concern having no appetite, numbness to thands/feet. Reviewed with MD, attempted to notify pt unable to reach. LMOVM for loss of appetite pt to try boost/ensure/protein shakes, if pt is experiencing motor problems with hands/feet to call back and notify Desk RN. Continue to monitor.  Requested pt to call back to confirm message received.

## 2012-12-09 ENCOUNTER — Encounter (INDEPENDENT_AMBULATORY_CARE_PROVIDER_SITE_OTHER): Payer: Self-pay

## 2012-12-09 ENCOUNTER — Encounter: Payer: Self-pay | Admitting: Family

## 2012-12-09 ENCOUNTER — Other Ambulatory Visit (HOSPITAL_BASED_OUTPATIENT_CLINIC_OR_DEPARTMENT_OTHER): Payer: 59 | Admitting: Lab

## 2012-12-09 ENCOUNTER — Ambulatory Visit (HOSPITAL_BASED_OUTPATIENT_CLINIC_OR_DEPARTMENT_OTHER): Payer: 59 | Admitting: Family

## 2012-12-09 ENCOUNTER — Encounter: Payer: Self-pay | Admitting: Oncology

## 2012-12-09 VITALS — BP 111/74 | HR 74 | Temp 98.6°F | Resp 18 | Ht 73.0 in | Wt 180.8 lb

## 2012-12-09 DIAGNOSIS — J029 Acute pharyngitis, unspecified: Secondary | ICD-10-CM

## 2012-12-09 DIAGNOSIS — G609 Hereditary and idiopathic neuropathy, unspecified: Secondary | ICD-10-CM

## 2012-12-09 DIAGNOSIS — C185 Malignant neoplasm of splenic flexure: Secondary | ICD-10-CM

## 2012-12-09 DIAGNOSIS — R5381 Other malaise: Secondary | ICD-10-CM

## 2012-12-09 DIAGNOSIS — C7800 Secondary malignant neoplasm of unspecified lung: Secondary | ICD-10-CM

## 2012-12-09 DIAGNOSIS — R634 Abnormal weight loss: Secondary | ICD-10-CM

## 2012-12-09 DIAGNOSIS — C787 Secondary malignant neoplasm of liver and intrahepatic bile duct: Secondary | ICD-10-CM

## 2012-12-09 DIAGNOSIS — C189 Malignant neoplasm of colon, unspecified: Secondary | ICD-10-CM

## 2012-12-09 LAB — COMPREHENSIVE METABOLIC PANEL (CC13)
Albumin: 3.7 g/dL (ref 3.5–5.0)
Alkaline Phosphatase: 138 U/L (ref 40–150)
BUN: 28.8 mg/dL — ABNORMAL HIGH (ref 7.0–26.0)
CO2: 25 mEq/L (ref 22–29)
Calcium: 10 mg/dL (ref 8.4–10.4)
Chloride: 102 mEq/L (ref 98–109)
Glucose: 109 mg/dl (ref 70–140)
Potassium: 4 mEq/L (ref 3.5–5.1)
Sodium: 139 mEq/L (ref 136–145)
Total Bilirubin: 1.01 mg/dL (ref 0.20–1.20)
Total Protein: 8.1 g/dL (ref 6.4–8.3)

## 2012-12-09 LAB — UA PROTEIN, DIPSTICK - CHCC: Protein, ur: NEGATIVE mg/dL

## 2012-12-09 LAB — CBC WITH DIFFERENTIAL/PLATELET
Basophils Absolute: 0 10*3/uL (ref 0.0–0.1)
Eosinophils Absolute: 0 10*3/uL (ref 0.0–0.5)
HCT: 38.8 % (ref 38.4–49.9)
HGB: 12.4 g/dL — ABNORMAL LOW (ref 13.0–17.1)
MCV: 87.6 fL (ref 79.3–98.0)
MONO#: 0.4 10*3/uL (ref 0.1–0.9)
NEUT#: 2.5 10*3/uL (ref 1.5–6.5)
RBC: 4.44 10*6/uL (ref 4.20–5.82)
RDW: 16.7 % — ABNORMAL HIGH (ref 11.0–14.6)
WBC: 4 10*3/uL (ref 4.0–10.3)
lymph#: 1.1 10*3/uL (ref 0.9–3.3)

## 2012-12-09 MED ORDER — MORPHINE SULFATE (CONCENTRATE) 20 MG/ML PO SOLN: 10.0000 mg | ORAL | Status: DC | PRN

## 2012-12-09 NOTE — Progress Notes (Addendum)
[facility]  Telephone:(336) [phone] Fax:([phone]) [phone]  OFFICE PROGRESS NOTE   ID: Christopher Fuentes   DOB: 04/03/1960  MR#: [id number]  JYN#:[id number]   PCP: No PCP Per [facility] [place]:  Christopher Fu[facility]   DIAGNOSIS: Christopher Fuentes is a 52 y.o. gentleman with metastatic colon carcinoma, originally was diagnosed in [facility].   PRIOR THERAPY: #1 In December 2013 the patient developed weeks of mild abdominal pain and decreased appetite with nausea and vomiting.  A CT in the ER revealed inflammatory colon mass at the splenic flexure with obvious metastasis to the liver.  In 01/2012 he underwent surgery for a large bowel obstruction which consisted of a partial colectomy with site to site functional and end to end staple anastomosis. Pathology revealed 6.0 cm tumor, invasive adenocarcinoma, low grade,  no lymphovascular invasion or perineural invasion. 0 of 15 lymph nodes were positive,  margins were negative. Pathologic stage T3 N0.   DNA MMR staining by immunohistochemistry was intact K.RAS mutation not found at exon 1 & 2 (including codone 12, 13, 14, 61).  #18 February 2012 CEA level was elevated at 120.    #3 In February 2014: CT chest, abdomen/pelvis revealed multiple bilateral lung masses,  at least 5 lesions in each lung, 2 cm lesion noted in segment of liver, multiple small mesenteric lymph nodes which were stable and not pathologically enlarged no bony lesions.   #4 In February 2014 a right liver biopsy of segment 5 showed metastatic adenocarcinoma consistent with colon origin, positive for CK 20 CDX2 and villin negative for PSA TTF-1 and CK 7.    #5 In  February 2014 adjuvant chemotherapy consisting of modified FOLFOX 6+ Avastin chemotherapy was started.  #6 In April 2014 CEA level 35 after third cycle of treatment.   CT chest, abdomen/pelvis: Stable bilateral subcentimeter pulmonary nodules mild decrease in left lower lobe nodule decrease in size of  lesion within the right hepatic lobe no new lesions seen   #22 Jun 2012 CEA level 11.6   #24 July 2012: Completed eighth cycle of treatment with modified FOLFOX 6+ Avastin chemotherapy  #9  Completed 12 cycles of modified FOLFOX/Avastin with day 3 Neulasta on 12/05/2012.   CURRENT THERAPY:   Observation - restaging PET scan will be ordered in the near future   INTERVAL HISTORY: Christopher Fuentes is a 52 y.o. male who returns for followup today after completing 12 cycles of modified FOLFOX/Avastin chemotherapy on 12/05/2012.  Overall he tolerated chemotherapy fairly well although she has a number of complaints today.  He has complaints of continued weight loss and no appetite, continued peripheral neuropathy affecting left metatarsals, persistent sore throat, excessively chapped lips and a decrease in energy/fatigue.  He was prescribed a mouthwash by [place] on 11/25/2012 for sore throat, but says the mouthwash is too thick for him to use.  His interval history is otherwise stable and unremarkable.   MEDICAL HISTORY: Past Medical History  Diagnosis Date  . Colon cancer 01/2012    stage IV   . 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 Hypertension 11/18/2012    ALLERGIES:  is allergic to other.  MEDICATIONS:  Current Outpatient Prescriptions  Medication Sig Dispense Refill  . gabapentin (NEURONTIN) 100 MG capsule Take 1 capsule (100 mg total) by mouth 3 (three) times daily.  90 capsule  6  . losartan-hydrochlorothiazide (HYZAAR) 100-25 MG per tablet Take 1 tablet by mouth daily.  30 tablet  6  .  cephALEXin (KEFLEX) 500 MG capsule Take 1 capsule (500 mg total) by mouth 4 (four) times daily.  28 capsule  1  . dexamethasone (DECADRON) 4 MG tablet Take 2 tablets (8 mg total) by mouth 2 (two) times daily with a meal. Take daily starting the day after chemotherapy for 2 days. Take with food.  30 tablet  1  . Diphenhyd-Hydrocort-Nystatin (FIRST-DUKES MOUTHWASH) SUSP Use as directed 5-10  mLs in the mouth or throat 4 (four) times daily as needed.  480 mL  3  . LORazepam (ATIVAN) 0.5 MG tablet Take 1 tablet (0.5 mg total) by mouth every 6 (six) hours as needed (Nausea or vomiting).  30 tablet  0  . morphine (ROXANOL) 20 MG/ML concentrated solution Take 0.5 mLs (10 mg total) by mouth every 4 (four) hours as needed for pain.  60 mL  0  . ondansetron (ZOFRAN) 8 MG tablet Take 1 tablet (8 mg total) by mouth 2 (two) times daily. Take two times a day starting the day after chemo for 2 days. Then take two times a day as needed for nausea or vomiting.  30 tablet  1  . prochlorperazine (COMPAZINE) 10 MG tablet Take 1 tablet (10 mg total) by mouth every 6 (six) hours as needed (Nausea or vomiting).  30 tablet  1  . valACYclovir (VALTREX) 500 MG tablet Take 1 tablet (500 mg total) by mouth 2 (two) times daily.  28 tablet  0   No current facility-administered medications for this visit.    SURGICAL HISTORY:  Past Surgical History  Procedure Laterality Date  . Colon resection  01/2012    REVIEW OF SYSTEMS:  A 10 point review of systems was completed and is negative except as noted above.  Christopher Fuentes denies any other symptomatology including fever or chills, headache, vision changes, swollen glands, cough or shortness of breath, chest pain or discomfort, nausea, vomiting, diarrhea, constipation, change in urinary or bowel habits, any other arthralgias/myalgias, unusual bleeding/bruising or any other symptomatology.      PHYSICAL EXAMINATION: Blood pressure 111/74, pulse 74, temperature 98.6 F (37 C), temperature source Oral, resp. rate 18, height 6\' 1"  (1.854 m), weight 180 lb 12.8 oz (82.01 kg). Body mass index is 23.86 kg/(m^2).  ECOG PERFORMANCE STATUS: 0 - Asymptomatic   General appearance: Alert, cooperative, well nourished, no apparent distress Head: Normocephalic, without obvious abnormality, atraumatic, voice hoarseness Eyes: Injected sclerae bilaterally, PERRLA, EOMI, bilateral  proptosis Nose: Nares, septum and mucosa are normal, postnasal drip, clear drainage, no maxillary or frontal sinus tenderness Neck: No adenopathy, supple, symmetrical, trachea midline, no tenderness Resp: Clear to auscultation bilaterally, no wheezes/rales/rhonchi Cardio: Regular rate and rhythm, S1, S2 normal, no murmur, click, rub or gallop, no edema, left chest Port-A-Cath without signs of infection GI: Soft, not distended, non-tender, hypoactive bowel sounds, no organomegaly Skin: No rashes/lesions, skin warm and dry, no erythematous areas, no cyanosis, diffuse hyperpigmentation on hands/face/lips and oral mucosa  M/S:  Atraumatic, normal strength in all extremities, normal range of motion, no clubbing  Lymph nodes: Cervical, supraclavicular, and axillary nodes normal Neurologic: Grossly normal, cranial nerves II through XII intact, alert and oriented x 3 Psych: Appropriate affect    LABORATORY DATA: Lab Results  Component Value Date   WBC 4.0 12/09/2012   HGB 12.4* 12/09/2012   HCT 38.8 12/09/2012   MCV 87.6 12/09/2012   PLT 73* 12/09/2012      Chemistry      Component Value Date/Time   NA 139  12/09/2012 1000   K 4.0 12/09/2012 1000   CO2 25 12/09/2012 1000   BUN 28.8* 12/09/2012 1000   CREATININE 1.7* 12/09/2012 1000      Component Value Date/Time   CALCIUM 10.0 12/09/2012 1000   ALKPHOS 138 12/09/2012 1000   AST 32 12/09/2012 1000   ALT 10 12/09/2012 1000   BILITOT 1.01 12/09/2012 1000       RADIOGRAPHIC STUDIES: No results found.    ASSESSMENT/PLAN: Nathanyal Ashmead is a 52 y.o. gentleman with:  #1 Metastatic colon carcinoma with liver and lung metastasis. Patient received palliative chemotherapy with modified FOLFOX 6 and Avastin x 12 cycles from 03/2012 through 12/05/2012.  A restaging PET scan has been ordered for Mr. Hoffmeister.  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  #3 Decreased appetite/sore throat/24 pound weight loss since 11/18/2012/fatigue:  Dr. Welton Flakes provided the patient with a prescription for Roxanol for mouth pain.  Mr. Tagg encouraged to use the Natural Dentist Mouthwash and Biotene for mouth discomfort.  He was encouraged to eat 3 balanced meals daily in addition to nutritional supplements (i.e.  Boost, Ensure, or [employer] Breakfast multiple times daily).  Mr. Hetland also encouraged to exercise daily (walking as tolerated) for persistent chemotherapy-induced fatigue.   #4 We plan to see Mr. Kervin in 3 weeks after his restaging PET scan to discuss plan of care after PET scan results.  We will check laboratories of CBC and CMP at that time.  All questions answered.  Mr. Sagan was encouraged to contact us in the interim with any questions, concerns, or problems.    Larina Bras, NP-C 12/11/2012  5:54 PM   ATTENDING'S ATTESTATION:  I personally reviewed patient's chart, examined patient myself, formulated the treatment plan as followed.    Patient has developed significant side effects from the last cycle of FOLFOX. This does include significant mucositis with pain associated with swallowing. I have recommended taking morphine IR elixir. He does have poor appetite and I have recommended that he take 4 in [place] a day. He also met with the nutritionist.  For his colon cancer we will plan on doing restaging studies. I did discuss with him the role of maintenance Avastin and possibly Xeloda or fall for your recent CT does have stage IV disease.  He will be seen back to 3 weeks' time or sooner if need arises.  Drue Second, MD Medical/Oncology [facility] [phone] (beeper) [phone] (Office)  12/11/2012, 10:07 PM

## 2012-12-09 NOTE — Telephone Encounter (Signed)
appts made and printed. Pt is aware that cs will call w/ appt for PET...td 

## 2012-12-09 NOTE — Patient Instructions (Signed)
Please contact us at [phone] if you have any questions or concerns.  Please continue to do well and enjoy life!!!  Get plenty of rest, drink plenty of water, exercise daily (walking), eat a balanced diet (take nutritional supplements i.e. [employer] Instant Breakfast, Boost, Ensure in addition to 3 meals daily).  Try Natural Dentist Mouthwash (found at the [employer])  Use Biotene  Use Acquaphor twice daily on dry skin.    Results for orders placed in visit on 12/09/12 (from the past 24 hour(s))  UA PROTEIN, DIPSTICK - [facility]     Status: None   Collection Time    12/09/12 10:00 AM      Result Value Range   Protein, ur Negative  Negative- <30 mg/dL   Narrative:    Performed At:  [facility]               [address]               [place], [place] [address]  CBC WITH DIFFERENTIAL     Status: Abnormal   Collection Time    12/09/12 10:00 AM      Result Value Range   WBC 4.0  4.0 - 10.3 10e3/uL   NEUT# 2.5  1.5 - 6.5 10e3/uL   HGB 12.4 (*) 13.0 - 17.1 g/dL   HCT 81.1  91.4 - 78.2 %   Platelets 73 (*) 140 - 400 10e3/uL   MCV 87.6  79.3 - 98.0 fL   MCH 27.9  27.2 - 33.4 pg   MCHC 31.9 (*) 32.0 - 36.0 g/dL   RBC 9.56  2.13 - 0.86 10e6/uL   RDW 16.7 (*) 11.0 - 14.6 %   lymph# 1.1  0.9 - 3.3 10e3/uL   MONO# 0.4  0.1 - 0.9 10e3/uL   Eosinophils Absolute 0.0  0.0 - 0.5 10e3/uL   Basophils Absolute 0.0  0.0 - 0.1 10e3/uL   NEUT% 63.0  39.0 - 75.0 %   LYMPH% 26.2  14.0 - 49.0 %   MONO% 9.1  0.0 - 14.0 %   EOS% 1.1  0.0 - 7.0 %   BASO% 0.6  0.0 - 2.0 %   Narrative:    Performed At:  [facility]               [address]               [place], [place] [address]  COMPREHENSIVE METABOLIC PANEL (CC13)     Status: Abnormal   Collection Time    12/09/12 10:00 AM      Result Value Range   Sodium 139  136 - 145 mEq/L   Potassium 4.0  3.5 - 5.1 mEq/L   Chloride 102  98 - 109 mEq/L   CO2 25  22 - 29 mEq/L   Glucose 109  70 - 140 mg/dl   BUN 96.2 (*) 7.0  - 95.2 mg/dL   Creatinine 1.7 (*) 0.7 - 1.3 mg/dL   Total Bilirubin 8.41  0.20 - 1.20 mg/dL   Alkaline Phosphatase 138  40 - 150 U/L   AST 32  5 - 34 U/L   ALT 10  0 - 55 U/L   Total Protein 8.1  6.4 - 8.3 g/dL   Albumin 3.7  3.5 - 5.0 g/dL   Calcium 32.4  8.4 - 40.1 mg/dL   Anion Gap 11  3 - 11 mEq/L   Narrative:  Performed At:  [facility]               [address]               [place], [place] [address]

## 2012-12-28 ENCOUNTER — Ambulatory Visit (HOSPITAL_COMMUNITY): Payer: 59

## 2012-12-30 ENCOUNTER — Other Ambulatory Visit (HOSPITAL_BASED_OUTPATIENT_CLINIC_OR_DEPARTMENT_OTHER): Payer: 59

## 2012-12-30 ENCOUNTER — Encounter: Payer: Self-pay | Admitting: Family

## 2012-12-30 ENCOUNTER — Ambulatory Visit (HOSPITAL_BASED_OUTPATIENT_CLINIC_OR_DEPARTMENT_OTHER): Payer: 59 | Admitting: Family

## 2012-12-30 ENCOUNTER — Telehealth: Payer: Self-pay | Admitting: Oncology

## 2012-12-30 VITALS — BP 133/80 | HR 62 | Temp 98.4°F | Resp 18 | Ht 73.0 in | Wt 192.9 lb

## 2012-12-30 DIAGNOSIS — G609 Hereditary and idiopathic neuropathy, unspecified: Secondary | ICD-10-CM

## 2012-12-30 DIAGNOSIS — C189 Malignant neoplasm of colon, unspecified: Secondary | ICD-10-CM

## 2012-12-30 DIAGNOSIS — C7800 Secondary malignant neoplasm of unspecified lung: Secondary | ICD-10-CM

## 2012-12-30 DIAGNOSIS — C787 Secondary malignant neoplasm of liver and intrahepatic bile duct: Secondary | ICD-10-CM

## 2012-12-30 DIAGNOSIS — R5381 Other malaise: Secondary | ICD-10-CM

## 2012-12-30 DIAGNOSIS — R634 Abnormal weight loss: Secondary | ICD-10-CM

## 2012-12-30 LAB — CBC WITH DIFFERENTIAL/PLATELET
BASO%: 1.3 % (ref 0.0–2.0)
EOS%: 1.9 % (ref 0.0–7.0)
HCT: 35.4 % — ABNORMAL LOW (ref 38.4–49.9)
HGB: 11.1 g/dL — ABNORMAL LOW (ref 13.0–17.1)
LYMPH%: 32 % (ref 14.0–49.0)
MCH: 27.7 pg (ref 27.2–33.4)
MCHC: 31.4 g/dL — ABNORMAL LOW (ref 32.0–36.0)
MONO#: 0.8 10*3/uL (ref 0.1–0.9)
NEUT%: 46.9 % (ref 39.0–75.0)
RBC: 4.01 10*6/uL — ABNORMAL LOW (ref 4.20–5.82)
WBC: 4.6 10*3/uL (ref 4.0–10.3)
lymph#: 1.5 10*3/uL (ref 0.9–3.3)

## 2012-12-30 LAB — COMPREHENSIVE METABOLIC PANEL (CC13)
ALT: 17 U/L (ref 0–55)
AST: 33 U/L (ref 5–34)
Albumin: 3.3 g/dL — ABNORMAL LOW (ref 3.5–5.0)
Alkaline Phosphatase: 83 U/L (ref 40–150)
Anion Gap: 10 mEq/L (ref 3–11)
CO2: 23 mEq/L (ref 22–29)
Glucose: 130 mg/dl (ref 70–140)
Potassium: 3.5 mEq/L (ref 3.5–5.1)
Sodium: 138 mEq/L (ref 136–145)
Total Bilirubin: 0.4 mg/dL (ref 0.20–1.20)
Total Protein: 7.5 g/dL (ref 6.4–8.3)

## 2012-12-30 LAB — UA PROTEIN, DIPSTICK - CHCC: Protein, ur: NEGATIVE mg/dL

## 2012-12-30 MED ORDER — CLINDAMYCIN PHOSPHATE 1 % EX LOTN: TOPICAL_LOTION | Freq: Two times a day (BID) | CUTANEOUS | Status: DC

## 2012-12-30 NOTE — Patient Instructions (Signed)
Please contact us at [phone] if you have any questions or concerns.  Please continue to do well and enjoy life!!!  Get plenty of rest, drink plenty of water, exercise daily (walking), eat a balanced diet (take nutritional supplements i.e. [employer] Instant Breakfast, Boost, Ensure in addition to 3 meals daily).  Use Acquaphor twice daily on dry skin.    Results for orders placed in visit on 12/30/12 (from the past 24 hour(s))  UA PROTEIN, DIPSTICK - [facility]     Status: None   Collection Time    12/30/12  9:08 AM      Result Value Range   Protein, ur Negative  Negative- <30 mg/dL   Narrative:    Performed At:  [facility]               [address]               [place], [place] [address]  CBC WITH DIFFERENTIAL     Status: Abnormal   Collection Time    12/30/12  9:08 AM      Result Value Range   WBC 4.6  4.0 - 10.3 10e3/uL   NEUT# 2.2  1.5 - 6.5 10e3/uL   HGB 11.1 (*) 13.0 - 17.1 g/dL   HCT 19.1 (*) 47.8 - 29.5 %   Platelets 187  140 - 400 10e3/uL   MCV 88.3  79.3 - 98.0 fL   MCH 27.7  27.2 - 33.4 pg   MCHC 31.4 (*) 32.0 - 36.0 g/dL   RBC 6.21 (*) 3.08 - 6.57 10e6/uL   RDW 18.8 (*) 11.0 - 14.6 %   lymph# 1.5  0.9 - 3.3 10e3/uL   MONO# 0.8  0.1 - 0.9 10e3/uL   Eosinophils Absolute 0.1  0.0 - 0.5 10e3/uL   Basophils Absolute 0.1  0.0 - 0.1 10e3/uL   NEUT% 46.9  39.0 - 75.0 %   LYMPH% 32.0  14.0 - 49.0 %   MONO% 17.9 (*) 0.0 - 14.0 %   EOS% 1.9  0.0 - 7.0 %   BASO% 1.3  0.0 - 2.0 %   Narrative:    Performed At:  [facility]               [address]               [place], [place] [address]

## 2012-12-30 NOTE — Progress Notes (Signed)
[facility]  Telephone:(336) [phone] Fax:(336) [phone]  OFFICE PROGRESS NOTE   ID: Christopher Fuentes   DOB: 11-15-1960  MR#: [id number]  JYN#:[id number]   PCP: No PCP [place]:  Dr. Gaylord Shih   DIAGNOSIS: Christopher Fuentes is a 52 y.o. gentleman with metastatic colon carcinoma, originally was diagnosed in [facility].   PRIOR THERAPY: #1 In December 2013 the patient developed weeks of mild abdominal pain and decreased appetite with nausea and vomiting.  A CT in the ER revealed inflammatory colon mass at the splenic flexure with obvious metastasis to the liver.  In 01/2012 he underwent surgery for a large bowel obstruction which consisted of a partial colectomy with site to site functional and end to end staple anastomosis. Pathology revealed 6.0 cm tumor, invasive adenocarcinoma, low grade,  no lymphovascular invasion or perineural invasion. 0 of 15 lymph nodes were positive,  margins were negative. Pathologic stage T3 N0.   DNA MMR staining by immunohistochemistry was intact K.RAS mutation not found at exon 1 & 2 (including codone 12, 13, 14, 61).  #18 February 2012 CEA level was elevated at 120.    #3 In February 2014: CT chest, abdomen/pelvis revealed multiple bilateral lung masses,  at least 5 lesions in each lung, 2 cm lesion noted in segment of liver, multiple small mesenteric lymph nodes which were stable and not pathologically enlarged no bony lesions.   #4 In February 2014 a right liver biopsy of segment 5 showed metastatic adenocarcinoma consistent with colon origin, positive for CK 20 CDX2 and villin negative for PSA TTF-1 and CK 7.   #5 In  February 2014 adjuvant chemotherapy consisting of modified FOLFOX 6+ Avastin chemotherapy was started.  #6 In April 2014 CEA level 35 after third cycle of treatment.   CT chest, abdomen/pelvis: Stable bilateral subcentimeter pulmonary nodules mild decrease in left lower lobe nodule decrease in size of  lesion within the right hepatic lobe no new lesions seen   #22 Jun 2012 CEA level 11.6   #24 July 2012: Completed eighth cycle of treatment with modified FOLFOX 6+ Avastin chemotherapy  #9  Completed 12 cycles of modified FOLFOX/Avastin with day 3 Neulasta on 12/05/2012.   CURRENT THERAPY:   Observation - restaging PET scan is scheduled for 01/05/2013.   INTERVAL HISTORY: Christopher Fuentes is a 52 y.o. male who returns for followup of metastatic colon cancer.  It will be recalled that Christopher Fuentes completed 12 cycles of modified FOLFOX/Avastin chemotherapy on 12/05/2012.  Overall he tolerated chemotherapy fairly well, but it will be recalled during his last office visit on 12/09/2012, he had a number of complaints including weight loss and no appetite, continued peripheral neuropathy affecting left metatarsals, persistent sore throat, excessively chapped lips and a decrease in energy/fatigue. During his office visit today, he reports that his throat has healed, his appetite is good, his mouth sores have healed, his neuropathy is better in his hands but persists in left fourth and fifth metatarsals, and he has gained 12 pounds since his last office visit.  Overall he is doing much better, and physically appears better.  His interval history is otherwise stable and unremarkable.   MEDICAL HISTORY: Past Medical History  Diagnosis Date  . Colon cancer 01/2012    stage IV   . 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 Hypertension 11/18/2012    ALLERGIES:   Allergies  Allergen Reactions  . Other Hives and Swelling  Sea food    MEDICATIONS:  Current Outpatient Prescriptions  Medication Sig Dispense Refill  . dexamethasone (DECADRON) 4 MG tablet Take 2 tablets (8 mg total) by mouth 2 (two) times daily with a meal. Take daily starting the day after chemotherapy for 2 days. Take with food.  30 tablet  1  . gabapentin (NEURONTIN) 100 MG capsule Take 1 capsule (100 mg total) by  mouth 3 (three) times daily.  90 capsule  6  . LORazepam (ATIVAN) 0.5 MG tablet Take 1 tablet (0.5 mg total) by mouth every 6 (six) hours as needed (Nausea or vomiting).  30 tablet  0  . losartan-hydrochlorothiazide (HYZAAR) 100-25 MG per tablet Take 1 tablet by mouth daily.  30 tablet  6  . morphine (ROXANOL) 20 MG/ML concentrated solution Take 0.5 mLs (10 mg total) by mouth every 4 (four) hours as needed for pain.  60 mL  0  . ondansetron (ZOFRAN) 8 MG tablet Take 1 tablet (8 mg total) by mouth 2 (two) times daily. Take two times a day starting the day after chemo for 2 days. Then take two times a day as needed for nausea or vomiting.  30 tablet  1  . prochlorperazine (COMPAZINE) 10 MG tablet Take 1 tablet (10 mg total) by mouth every 6 (six) hours as needed (Nausea or vomiting).  30 tablet  1  . valACYclovir (VALTREX) 500 MG tablet Take 1 tablet (500 mg total) by mouth 2 (two) times daily.  28 tablet  0  . cephALEXin (KEFLEX) 500 MG capsule Take 1 capsule (500 mg total) by mouth 4 (four) times daily.  28 capsule  1  . clindamycin (CLEOCIN-T) 1 % lotion Apply topically 2 (two) times daily.  60 mL  0  . Diphenhyd-Hydrocort-Nystatin (FIRST-DUKES MOUTHWASH) SUSP Use as directed 5-10 mLs in the mouth or throat 4 (four) times daily as needed.  480 mL  3   No current facility-administered medications for this visit.    SURGICAL HISTORY:  Past Surgical History  Procedure Laterality Date  . Colon resection  01/2012    REVIEW OF SYSTEMS:  A 10 point review of systems was completed and is negative except as noted above.  Christopher Fuentes denies any other symptomatology including fever or chills, headache, vision changes, swollen glands, cough or shortness of breath, chest pain or discomfort, nausea, vomiting, diarrhea, constipation, change in urinary or bowel habits, any other arthralgias/myalgias, unusual bleeding/bruising or any other symptomatology.      PHYSICAL EXAMINATION: Blood pressure 133/80,  pulse 62, temperature 98.4 F (36.9 C), temperature source Oral, resp. rate 18, height 6\' 1"  (1.854 m), weight 192 lb 14.4 oz (87.499 kg). Body mass index is 25.46 kg/(m^2).  ECOG PERFORMANCE STATUS: 1 - Symptomatic but completely ambulatory   General appearance: Alert, cooperative, well nourished, no apparent distress Head: Normocephalic, without obvious abnormality, atraumatic, voice hoarseness Eyes: Injected sclerae bilaterally, PERRLA, EOMI, bilateral proptosis Nose: Nares, septum and mucosa are normal, postnasal drip, clear drainage, no maxillary or frontal sinus tenderness Neck: No adenopathy, supple, symmetrical, trachea midline, no tenderness Resp: Clear to auscultation bilaterally, no wheezes/rales/rhonchi Cardio: Regular rate and rhythm, S1, S2 normal, no murmur, click, rub or gallop, no edema, left chest Port-A-Cath without signs of infection GI: Soft, not distended, non-tender, hypoactive bowel sounds, no organomegaly Skin: No rashes/lesions, skin warm and dry, no erythematous areas, no cyanosis, diffuse hyperpigmentation on hands/face/lips and oral mucosa  M/S:  Atraumatic, normal strength in all extremities, normal range of motion, no  clubbing  Lymph nodes: Cervical, supraclavicular, and axillary nodes normal Neurologic: Grossly normal, cranial nerves II through XII intact, alert and oriented x 3 Psych: Appropriate affect    LABORATORY DATA: Lab Results  Component Value Date   WBC 4.6 12/30/2012   HGB 11.1* 12/30/2012   HCT 35.4* 12/30/2012   MCV 88.3 12/30/2012   PLT 187 12/30/2012      Chemistry      Component Value Date/Time   NA 138 12/30/2012 0908   K 3.5 12/30/2012 0908   CO2 23 12/30/2012 0908   BUN 12.3 12/30/2012 0908   CREATININE 1.1 12/30/2012 0908      Component Value Date/Time   CALCIUM 9.4 12/30/2012 0908   ALKPHOS 83 12/30/2012 0908   AST 33 12/30/2012 0908   ALT 17 12/30/2012 0908   BILITOT 0.40 12/30/2012 0908       RADIOGRAPHIC  STUDIES: No results found.    ASSESSMENT/PLAN: Remi Lopata is a 52 y.o. gentleman with: #1 Metastatic colon carcinoma with liver and lung metastasis. Patient received palliative chemotherapy with modified FOLFOX 6 and Avastin x 12 cycles from 03/2012 through 12/05/2012. A restaging PET scan has been ordered for Christopher Fuentes.   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   #3 Weight loss: Christopher Fuentes was encourage to continue using Roxanol, [employer] Mouthwash and Biotene for any mouth discomfort. He was also encouraged to continue to consume 3 balanced meals daily in addition to nutritional supplements (i.e. Boost, Ensure, or [employer] Breakfast multiple times daily). Christopher Fuentes was encouraged to exercise daily (walking as tolerated) to combat fatigue.   #4 We plan to see Christopher Fuentes again on 01/09/2013 after his restaging PET scan that is scheduled for 01/05/2013 to discuss his plan of care after PET scan results. We will check laboratories of CBC and CMP at that time.   An electronic prescription for Cleocin lotion was sent to the patient's pharmacy for ongoing skin care related to chemotherapy-induced bilateral hand hyperpigmentation/rash.  All questions answered. Christopher Fuentes was encouraged to contact us in the interim with any questions, concerns, or problems.    Larina Bras, NP-C 01/01/2013  6:34 PM

## 2013-01-04 ENCOUNTER — Telehealth: Payer: Self-pay | Admitting: *Deleted

## 2013-01-04 NOTE — Telephone Encounter (Signed)
Call from steve in radiology, pt's PET scan was denied by Insurance and appt for 11/20 will have to be cancelled. Called and notified pt.  Message forwarded to Provider for review.

## 2013-01-05 ENCOUNTER — Encounter (HOSPITAL_COMMUNITY): Payer: 59

## 2013-01-09 ENCOUNTER — Other Ambulatory Visit (HOSPITAL_BASED_OUTPATIENT_CLINIC_OR_DEPARTMENT_OTHER): Payer: 59 | Admitting: Lab

## 2013-01-09 ENCOUNTER — Ambulatory Visit (HOSPITAL_BASED_OUTPATIENT_CLINIC_OR_DEPARTMENT_OTHER): Payer: 59 | Admitting: Adult Health

## 2013-01-09 VITALS — BP 118/72 | HR 83 | Temp 98.3°F | Resp 18 | Ht 73.0 in | Wt 191.3 lb

## 2013-01-09 DIAGNOSIS — G609 Hereditary and idiopathic neuropathy, unspecified: Secondary | ICD-10-CM

## 2013-01-09 DIAGNOSIS — C7800 Secondary malignant neoplasm of unspecified lung: Secondary | ICD-10-CM

## 2013-01-09 DIAGNOSIS — C189 Malignant neoplasm of colon, unspecified: Secondary | ICD-10-CM

## 2013-01-09 DIAGNOSIS — C787 Secondary malignant neoplasm of liver and intrahepatic bile duct: Secondary | ICD-10-CM

## 2013-01-09 DIAGNOSIS — C187 Malignant neoplasm of sigmoid colon: Secondary | ICD-10-CM

## 2013-01-09 DIAGNOSIS — C185 Malignant neoplasm of splenic flexure: Secondary | ICD-10-CM

## 2013-01-09 LAB — CBC WITH DIFFERENTIAL/PLATELET
Basophils Absolute: 0 10*3/uL (ref 0.0–0.1)
EOS%: 12.8 % — ABNORMAL HIGH (ref 0.0–7.0)
HCT: 37.2 % — ABNORMAL LOW (ref 38.4–49.9)
HGB: 11.9 g/dL — ABNORMAL LOW (ref 13.0–17.1)
LYMPH%: 34.7 % (ref 14.0–49.0)
MCH: 27.7 pg (ref 27.2–33.4)
MCHC: 31.8 g/dL — ABNORMAL LOW (ref 32.0–36.0)
MCV: 87.1 fL (ref 79.3–98.0)
MONO%: 15.2 % — ABNORMAL HIGH (ref 0.0–14.0)
NEUT#: 1.9 10*3/uL (ref 1.5–6.5)
NEUT%: 36.7 % — ABNORMAL LOW (ref 39.0–75.0)
RDW: 19.5 % — ABNORMAL HIGH (ref 11.0–14.6)

## 2013-01-09 LAB — COMPREHENSIVE METABOLIC PANEL (CC13)
AST: 26 U/L (ref 5–34)
Alkaline Phosphatase: 74 U/L (ref 40–150)
Anion Gap: 10 mEq/L (ref 3–11)
BUN: 30.5 mg/dL — ABNORMAL HIGH (ref 7.0–26.0)
CO2: 21 mEq/L — ABNORMAL LOW (ref 22–29)
Calcium: 9.5 mg/dL (ref 8.4–10.4)
Creatinine: 1.6 mg/dL — ABNORMAL HIGH (ref 0.7–1.3)
Potassium: 3.9 mEq/L (ref 3.5–5.1)
Sodium: 140 mEq/L (ref 136–145)
Total Bilirubin: 0.84 mg/dL (ref 0.20–1.20)

## 2013-01-09 NOTE — Progress Notes (Signed)
[facility]  Telephone:(336) [phone] Fax:(336) [phone]  OFFICE PROGRESS NOTE   ID: Christopher Fuentes   DOB: Feb 06, 1961  MR#: [id number]  AOZ#:[id number]   PCP: No PCP [place]:  Dr. Gaylord Shih   DIAGNOSIS: Christopher Fuentes is a 52 y.o. gentleman with metastatic colon carcinoma, originally was diagnosed in [facility]   PRIOR THERAPY: #1 In December 2013 the patient developed weeks of mild abdominal pain and decreased appetite with nausea and vomiting.  A CT in the ER revealed inflammatory colon mass at the splenic flexure with obvious metastasis to the liver.  In 01/2012 he underwent surgery for a large bowel obstruction which consisted of a partial colectomy with site to site functional and end to end staple anastomosis. Pathology revealed 6.0 cm tumor, invasive adenocarcinoma, low grade,  no lymphovascular invasion or perineural invasion. 0 of 15 lymph nodes were positive,  margins were negative. Pathologic stage T3 N0.   DNA MMR staining by immunohistochemistry was intact K.RAS mutation not found at exon 1 & 2 (including codone 12, 13, 14, 61).  #18 February 2012 CEA level was elevated at 120.    #3 In February 2014: CT chest, abdomen/pelvis revealed multiple bilateral lung masses,  at least 5 lesions in each lung, 2 cm lesion noted in segment of liver, multiple small mesenteric lymph nodes which were stable and not pathologically enlarged no bony lesions.   #4 In February 2014 a right liver biopsy of segment 5 showed metastatic adenocarcinoma consistent with colon origin, positive for CK 20 CDX2 and villin negative for PSA TTF-1 and CK 7.   #5 In  February 2014 adjuvant chemotherapy consisting of modified FOLFOX 6+ Avastin chemotherapy was started.  #6 In April 2014 CEA level 35 after third cycle of treatment.   CT chest, abdomen/pelvis: Stable bilateral subcentimeter pulmonary nodules mild decrease in left lower lobe nodule decrease in size of  lesion within the right hepatic lobe no new lesions seen   #22 Jun 2012 CEA level 11.6   #24 July 2012: Completed eighth cycle of treatment with modified FOLFOX 6+ Avastin chemotherapy  #9  Completed 12 cycles of modified FOLFOX/Avastin with day 3 Neulasta on 12/05/2012.  CURRENT THERAPY:   Observation - restaging PET scan pending due to insurance.  INTERVAL HISTORY: Christopher Fuentes is a 52 y.o. male who returns for followup of metastatic colon cancer.   [name] completed 12 cycles of modified FOLFOX/Avastin chemotherapy on 12/05/2012.  He is doing well today.  We are still in the process of contacting his insurance company to get his PET scan reviewed.  He denies fevers, chills, nausea, vomiting, constipation, numbness, pain.  He has a mild amount of diarrhea he attributes to eating excessive chocolate.  Otherwise, a 10 point ROS is neg.  MEDICAL HISTORY: Past Medical History  Diagnosis Date  . Colon cancer 01/2012    stage IV   . 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 Hypertension 11/18/2012    ALLERGIES:   Allergies  Allergen Reactions  . Other Hives and Swelling    Sea food    MEDICATIONS:  Current Outpatient Prescriptions  Medication Sig Dispense Refill  . clindamycin (CLEOCIN-T) 1 % lotion Apply topically 2 (two) times daily.  60 mL  0  . gabapentin (NEURONTIN) 100 MG capsule Take 1 capsule (100 mg total) by mouth 3 (three) times daily.  90 capsule  6  . losartan-hydrochlorothiazide (HYZAAR) 100-25 MG per  tablet Take 1 tablet by mouth daily.  30 tablet  6  . dexamethasone (DECADRON) 4 MG tablet Take 2 tablets (8 mg total) by mouth 2 (two) times daily with a meal. Take daily starting the day after chemotherapy for 2 days. Take with food.  30 tablet  1  . Diphenhyd-Hydrocort-Nystatin (FIRST-DUKES MOUTHWASH) SUSP Use as directed 5-10 mLs in the mouth or throat 4 (four) times daily as needed.  480 mL  3  . prochlorperazine (COMPAZINE) 10 MG tablet Take 1  tablet (10 mg total) by mouth every 6 (six) hours as needed (Nausea or vomiting).  30 tablet  1   No current facility-administered medications for this visit.    SURGICAL HISTORY:  Past Surgical History  Procedure Laterality Date  . Colon resection  01/2012    REVIEW OF SYSTEMS:  A 10 point review of systems was completed and is negative except as noted above.      PHYSICAL EXAMINATION: Blood pressure 118/72, pulse 83, temperature 98.3 F (36.8 C), temperature source Oral, resp. rate 18, height 6\' 1"  (1.854 m), weight 191 lb 4.8 oz (86.773 kg). Body mass index is 25.24 kg/(m^2). General: Patient is a well appearing male in no acute distress HEENT: PERRLA, sclerae anicteric no conjunctival pallor, MMM Neck: supple, no palpable adenopathy Lungs: clear to auscultation bilaterally, no wheezes, rhonchi, or rales Cardiovascular: regular rate rhythm, S1, S2, no murmurs, rubs or gallops Abdomen: Soft, non-tender, non-distended, normoactive bowel sounds, no HSM Extremities: warm and well perfused, no clubbing, cyanosis, or edema Skin: No rashes or lesions Neuro: Non-focal ECOG PERFORMANCE STATUS: 1 - Symptomatic but completely ambulatory  LABORATORY DATA: Lab Results  Component Value Date   WBC 5.2 01/09/2013   HGB 11.9* 01/09/2013   HCT 37.2* 01/09/2013   MCV 87.1 01/09/2013   PLT 223 01/09/2013      Chemistry      Component Value Date/Time   NA 140 01/09/2013 1320   K 3.9 01/09/2013 1320   CO2 21* 01/09/2013 1320   BUN 30.5* 01/09/2013 1320   CREATININE 1.6* 01/09/2013 1320      Component Value Date/Time   CALCIUM 9.5 01/09/2013 1320   ALKPHOS 74 01/09/2013 1320   AST 26 01/09/2013 1320   ALT 13 01/09/2013 1320   BILITOT 0.84 01/09/2013 1320       RADIOGRAPHIC STUDIES: No results found.  ASSESSMENT/PLAN: Christopher Fuentes is a 52 y.o. gentleman with:  #1 Metastatic colon carcinoma with liver and lung metastasis. Patient received palliative chemotherapy with  modified FOLFOX 6 and Avastin x 12 cycles from 03/2012 through 12/05/2012. A restaging PET scan has been ordered for Mr. Kohles.   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   #3  I will need to get his PET scan approved.  I am working with Bonita Quin in managed care on this to get an answer this week.  I will call him once we get that scheduled to schedule an appointment with [place] to discuss his future treatment plans.    All questions answered. Mr. Wagar was encouraged to contact us in the interim with any questions, concerns, or problems.   I spent 15 minutes counseling the patient face to face.  The total time spent in the appointment was 30 minutes.  Illa Level, NP Medical Oncology [facility] [phone] 01/10/2013  1:53 PM

## 2013-01-10 ENCOUNTER — Encounter: Payer: Self-pay | Admitting: Adult Health

## 2013-01-18 ENCOUNTER — Telehealth: Payer: Self-pay | Admitting: *Deleted

## 2013-01-18 NOTE — Telephone Encounter (Signed)
Per NP request. Attempted several phone calls to patient's number and wife's number. Patient's number is not in service and wife's phone doesn't allow you to leave a message. The call was to let patient know the PET scan is still being reviewed by insurance. NP is checking daily concerning this matter. Message forwarded to NP.

## 2013-02-17 ENCOUNTER — Encounter: Payer: Self-pay | Admitting: Oncology

## 2013-02-17 NOTE — Progress Notes (Unsigned)
02/17/2013  After an extended period of time that included a [place], the Pet Scan for this patient NO LONGER REQUIRES PRE-AUTHORIZATION.   I have printed this information from the web site and also forwarded a copy to become a part of the medical record.    I called [employer] to verify the information on the web and was told by Ms. Demeras, customer service representative tht this policy did not require pre-cert at this time.  Benedetto Goad [phone]

## 2013-02-20 ENCOUNTER — Other Ambulatory Visit: Payer: Self-pay | Admitting: Emergency Medicine

## 2013-02-20 DIAGNOSIS — C189 Malignant neoplasm of colon, unspecified: Secondary | ICD-10-CM

## 2013-02-20 DIAGNOSIS — C787 Secondary malignant neoplasm of liver and intrahepatic bile duct: Secondary | ICD-10-CM

## 2013-03-01 ENCOUNTER — Ambulatory Visit (HOSPITAL_BASED_OUTPATIENT_CLINIC_OR_DEPARTMENT_OTHER): Payer: 59

## 2013-03-01 ENCOUNTER — Ambulatory Visit (HOSPITAL_COMMUNITY)
Admission: RE | Admit: 2013-03-01 | Discharge: 2013-03-01 | Disposition: A | Discharge status: Home / Self Care | Payer: 59 | Source: Ambulatory Visit | Attending: Oncology | Admitting: Oncology

## 2013-03-01 VITALS — BP 126/83 | HR 71 | Temp 98.6°F

## 2013-03-01 DIAGNOSIS — Z452 Encounter for adjustment and management of vascular access device: Secondary | ICD-10-CM

## 2013-03-01 DIAGNOSIS — C189 Malignant neoplasm of colon, unspecified: Secondary | ICD-10-CM | POA: Insufficient documentation

## 2013-03-01 DIAGNOSIS — C185 Malignant neoplasm of splenic flexure: Secondary | ICD-10-CM

## 2013-03-01 DIAGNOSIS — Z95828 Presence of other vascular implants and grafts: Secondary | ICD-10-CM

## 2013-03-01 DIAGNOSIS — Z9049 Acquired absence of other specified parts of digestive tract: Secondary | ICD-10-CM | POA: Insufficient documentation

## 2013-03-01 DIAGNOSIS — K7689 Other specified diseases of liver: Secondary | ICD-10-CM | POA: Insufficient documentation

## 2013-03-01 DIAGNOSIS — R918 Other nonspecific abnormal finding of lung field: Secondary | ICD-10-CM | POA: Insufficient documentation

## 2013-03-01 DIAGNOSIS — C787 Secondary malignant neoplasm of liver and intrahepatic bile duct: Secondary | ICD-10-CM

## 2013-03-01 LAB — GLUCOSE, CAPILLARY: Glucose-Capillary: 103 mg/dL — ABNORMAL HIGH (ref 70–99)

## 2013-03-01 MED ORDER — FLUDEOXYGLUCOSE F - 18 (FDG) INJECTION
18.8000 | Freq: Once | INTRAVENOUS | Status: AC | PRN
  Administered 2013-03-01: 18.8 via INTRAVENOUS

## 2013-03-01 MED ORDER — SODIUM CHLORIDE 0.9 % IJ SOLN
10.0000 mL | INTRAMUSCULAR | Status: DC | PRN
  Administered 2013-03-01: 10 mL via INTRAVENOUS
  Filled 2013-03-01: qty 10

## 2013-03-01 MED ORDER — HEPARIN SOD (PORK) LOCK FLUSH 100 UNIT/ML IV SOLN
500.0000 [IU] | Freq: Once | INTRAVENOUS | Status: AC
  Administered 2013-03-01: 500 [IU] via INTRAVENOUS
  Filled 2013-03-01: qty 5

## 2013-03-01 NOTE — Patient Instructions (Signed)

## 2013-03-07 ENCOUNTER — Other Ambulatory Visit: Payer: Self-pay | Admitting: Oncology

## 2013-03-07 ENCOUNTER — Telehealth: Payer: Self-pay | Admitting: Oncology

## 2013-03-07 DIAGNOSIS — C189 Malignant neoplasm of colon, unspecified: Secondary | ICD-10-CM

## 2013-03-07 DIAGNOSIS — C787 Secondary malignant neoplasm of liver and intrahepatic bile duct: Principal | ICD-10-CM

## 2013-03-07 NOTE — Telephone Encounter (Signed)
lmonvm for pt (cell) re appt for 1/23 @ 3:30pm. primary # not working. schedule mailed.

## 2013-03-07 NOTE — Progress Notes (Signed)
PET SCAN WAS DENIED. PUT ON [facility] DESK.

## 2013-03-10 ENCOUNTER — Other Ambulatory Visit: Payer: Self-pay | Admitting: *Deleted

## 2013-03-10 ENCOUNTER — Encounter: Payer: Self-pay | Admitting: Oncology

## 2013-03-10 ENCOUNTER — Ambulatory Visit (HOSPITAL_BASED_OUTPATIENT_CLINIC_OR_DEPARTMENT_OTHER): Payer: 59 | Admitting: Oncology

## 2013-03-10 ENCOUNTER — Other Ambulatory Visit (HOSPITAL_BASED_OUTPATIENT_CLINIC_OR_DEPARTMENT_OTHER): Payer: 59

## 2013-03-10 VITALS — BP 145/84 | HR 71 | Temp 98.1°F | Resp 18 | Ht 73.0 in | Wt 215.7 lb

## 2013-03-10 DIAGNOSIS — C7800 Secondary malignant neoplasm of unspecified lung: Secondary | ICD-10-CM

## 2013-03-10 DIAGNOSIS — C787 Secondary malignant neoplasm of liver and intrahepatic bile duct: Principal | ICD-10-CM

## 2013-03-10 DIAGNOSIS — C189 Malignant neoplasm of colon, unspecified: Secondary | ICD-10-CM

## 2013-03-10 DIAGNOSIS — C185 Malignant neoplasm of splenic flexure: Secondary | ICD-10-CM

## 2013-03-10 LAB — CBC WITH DIFFERENTIAL/PLATELET
BASO%: 0.6 % (ref 0.0–2.0)
Basophils Absolute: 0 10*3/uL (ref 0.0–0.1)
EOS ABS: 0.1 10*3/uL (ref 0.0–0.5)
EOS%: 1.5 % (ref 0.0–7.0)
HCT: 40.3 % (ref 38.4–49.9)
HGB: 13 g/dL (ref 13.0–17.1)
LYMPH%: 48.9 % (ref 14.0–49.0)
MCH: 27.8 pg (ref 27.2–33.4)
MCHC: 32.3 g/dL (ref 32.0–36.0)
MCV: 86 fL (ref 79.3–98.0)
MONO#: 0.5 10*3/uL (ref 0.1–0.9)
MONO%: 15.2 % — AB (ref 0.0–14.0)
NEUT%: 33.8 % — ABNORMAL LOW (ref 39.0–75.0)
NEUTROS ABS: 1.1 10*3/uL — AB (ref 1.5–6.5)
PLATELETS: 190 10*3/uL (ref 140–400)
RBC: 4.69 10*6/uL (ref 4.20–5.82)
RDW: 14.7 % — AB (ref 11.0–14.6)
WBC: 3.3 10*3/uL — ABNORMAL LOW (ref 4.0–10.3)
lymph#: 1.6 10*3/uL (ref 0.9–3.3)

## 2013-03-10 LAB — COMPREHENSIVE METABOLIC PANEL (CC13)
ALK PHOS: 75 U/L (ref 40–150)
ALT: 28 U/L (ref 0–55)
AST: 39 U/L — ABNORMAL HIGH (ref 5–34)
Albumin: 4.1 g/dL (ref 3.5–5.0)
Anion Gap: 10 mEq/L (ref 3–11)
BILIRUBIN TOTAL: 0.64 mg/dL (ref 0.20–1.20)
BUN: 16.4 mg/dL (ref 7.0–26.0)
CO2: 28 mEq/L (ref 22–29)
Calcium: 9.8 mg/dL (ref 8.4–10.4)
Chloride: 103 mEq/L (ref 98–109)
Creatinine: 1.4 mg/dL — ABNORMAL HIGH (ref 0.7–1.3)
GLUCOSE: 100 mg/dL (ref 70–140)
POTASSIUM: 3.8 meq/L (ref 3.5–5.1)
Sodium: 140 mEq/L (ref 136–145)
Total Protein: 8.2 g/dL (ref 6.4–8.3)

## 2013-03-10 LAB — UA PROTEIN, DIPSTICK - CHCC: Protein, ur: NEGATIVE mg/dL

## 2013-03-10 MED ORDER — LORAZEPAM 0.5 MG PO TABS: 0.5000 mg | ORAL_TABLET | Freq: Four times a day (QID) | ORAL | Status: DC | PRN

## 2013-03-10 MED ORDER — PROCHLORPERAZINE MALEATE 10 MG PO TABS: 10.0000 mg | ORAL_TABLET | Freq: Four times a day (QID) | ORAL | Status: DC | PRN

## 2013-03-10 MED ORDER — CLINDAMYCIN PHOSPHATE 1 % EX LOTN: TOPICAL_LOTION | Freq: Two times a day (BID) | CUTANEOUS | Status: DC

## 2013-03-10 MED ORDER — GABAPENTIN 100 MG PO CAPS: 100.0000 mg | ORAL_CAPSULE | Freq: Three times a day (TID) | ORAL | Status: DC

## 2013-03-10 MED ORDER — ONDANSETRON HCL 8 MG PO TABS: 8.0000 mg | ORAL_TABLET | Freq: Two times a day (BID) | ORAL | Status: DC | PRN

## 2013-03-10 MED ORDER — CAPECITABINE 500 MG PO TABS: 1000.0000 mg/m2 | ORAL_TABLET | Freq: Two times a day (BID) | ORAL | Status: DC

## 2013-03-10 NOTE — Progress Notes (Signed)
Lorazepam Rx called in to local pharmacy. Faxed Xeloda Rx to [facility] pharmacy.

## 2013-03-11 LAB — CEA: CEA: 8.4 ng/mL — AB (ref 0.0–5.0)

## 2013-03-13 ENCOUNTER — Other Ambulatory Visit: Payer: Self-pay | Admitting: Emergency Medicine

## 2013-03-13 ENCOUNTER — Telehealth: Payer: Self-pay | Admitting: Oncology

## 2013-03-13 NOTE — Telephone Encounter (Signed)
, °

## 2013-03-14 ENCOUNTER — Telehealth: Payer: Self-pay | Admitting: Emergency Medicine

## 2013-03-14 NOTE — Telephone Encounter (Signed)
Left message with Delcie Roch at [facility] with current ht and wt of patient and the correct quantity to be filled.

## 2013-03-17 ENCOUNTER — Other Ambulatory Visit (HOSPITAL_BASED_OUTPATIENT_CLINIC_OR_DEPARTMENT_OTHER): Payer: 59

## 2013-03-17 ENCOUNTER — Other Ambulatory Visit: Payer: Self-pay | Admitting: Oncology

## 2013-03-17 ENCOUNTER — Encounter: Payer: Self-pay | Admitting: *Deleted

## 2013-03-17 ENCOUNTER — Ambulatory Visit (HOSPITAL_BASED_OUTPATIENT_CLINIC_OR_DEPARTMENT_OTHER): Payer: 59

## 2013-03-17 VITALS — BP 110/74 | HR 49 | Temp 97.2°F | Resp 18

## 2013-03-17 DIAGNOSIS — C7800 Secondary malignant neoplasm of unspecified lung: Secondary | ICD-10-CM

## 2013-03-17 DIAGNOSIS — Z452 Encounter for adjustment and management of vascular access device: Secondary | ICD-10-CM

## 2013-03-17 DIAGNOSIS — C189 Malignant neoplasm of colon, unspecified: Secondary | ICD-10-CM

## 2013-03-17 DIAGNOSIS — C185 Malignant neoplasm of splenic flexure: Secondary | ICD-10-CM

## 2013-03-17 DIAGNOSIS — C787 Secondary malignant neoplasm of liver and intrahepatic bile duct: Secondary | ICD-10-CM

## 2013-03-17 DIAGNOSIS — Z5112 Encounter for antineoplastic immunotherapy: Secondary | ICD-10-CM

## 2013-03-17 DIAGNOSIS — Z5111 Encounter for antineoplastic chemotherapy: Secondary | ICD-10-CM

## 2013-03-17 LAB — UA PROTEIN, DIPSTICK - CHCC: Protein, ur: NEGATIVE mg/dL

## 2013-03-17 LAB — CBC WITH DIFFERENTIAL/PLATELET
BASO%: 0.5 % (ref 0.0–2.0)
BASOS ABS: 0 10*3/uL (ref 0.0–0.1)
EOS ABS: 0.1 10*3/uL (ref 0.0–0.5)
EOS%: 4 % (ref 0.0–7.0)
HCT: 37.8 % — ABNORMAL LOW (ref 38.4–49.9)
HEMOGLOBIN: 12.2 g/dL — AB (ref 13.0–17.1)
LYMPH%: 47.1 % (ref 14.0–49.0)
MCH: 28 pg (ref 27.2–33.4)
MCHC: 32.3 g/dL (ref 32.0–36.0)
MCV: 86.5 fL (ref 79.3–98.0)
MONO#: 0.5 10*3/uL (ref 0.1–0.9)
MONO%: 15.2 % — ABNORMAL HIGH (ref 0.0–14.0)
NEUT%: 33.2 % — ABNORMAL LOW (ref 39.0–75.0)
NEUTROS ABS: 1 10*3/uL — AB (ref 1.5–6.5)
PLATELETS: 158 10*3/uL (ref 140–400)
RBC: 4.38 10*6/uL (ref 4.20–5.82)
RDW: 14.5 % (ref 11.0–14.6)
WBC: 3.1 10*3/uL — AB (ref 4.0–10.3)
lymph#: 1.5 10*3/uL (ref 0.9–3.3)

## 2013-03-17 LAB — COMPREHENSIVE METABOLIC PANEL (CC13)
ALBUMIN: 3.6 g/dL (ref 3.5–5.0)
ALT: 25 U/L (ref 0–55)
ANION GAP: 9 meq/L (ref 3–11)
AST: 31 U/L (ref 5–34)
Alkaline Phosphatase: 55 U/L (ref 40–150)
BUN: 16.3 mg/dL (ref 7.0–26.0)
CO2: 25 mEq/L (ref 22–29)
Calcium: 9.7 mg/dL (ref 8.4–10.4)
Chloride: 108 mEq/L (ref 98–109)
Creatinine: 1.3 mg/dL (ref 0.7–1.3)
GLUCOSE: 115 mg/dL (ref 70–140)
POTASSIUM: 4 meq/L (ref 3.5–5.1)
SODIUM: 142 meq/L (ref 136–145)
TOTAL PROTEIN: 7.1 g/dL (ref 6.4–8.3)
Total Bilirubin: 0.39 mg/dL (ref 0.20–1.20)

## 2013-03-17 MED ORDER — ALTEPLASE 2 MG IJ SOLR
2.0000 mg | Freq: Once | INTRAMUSCULAR | Status: AC | PRN
  Administered 2013-03-17: 2 mg
  Filled 2013-03-17: qty 2

## 2013-03-17 MED ORDER — SODIUM CHLORIDE 0.9 % IV SOLN
Freq: Once | INTRAVENOUS | Status: AC
  Administered 2013-03-17: 10:00:00 via INTRAVENOUS

## 2013-03-17 MED ORDER — SODIUM CHLORIDE 0.9 % IV SOLN
15.0000 mg/kg | Freq: Once | INTRAVENOUS | Status: AC
  Administered 2013-03-17: 1475 mg via INTRAVENOUS
  Filled 2013-03-17: qty 59

## 2013-03-17 MED ORDER — HEPARIN SOD (PORK) LOCK FLUSH 100 UNIT/ML IV SOLN
500.0000 [IU] | Freq: Once | INTRAVENOUS | Status: AC | PRN
  Administered 2013-03-17: 500 [IU]
  Filled 2013-03-17: qty 5

## 2013-03-17 MED ORDER — SODIUM CHLORIDE 0.9 % IJ SOLN
10.0000 mL | INTRAMUSCULAR | Status: DC | PRN
  Administered 2013-03-17: 10 mL
  Filled 2013-03-17: qty 10

## 2013-03-17 NOTE — Progress Notes (Signed)
Per Mendel Ryder, NP, OK to treat today despite counts.

## 2013-03-17 NOTE — Progress Notes (Signed)
RECEIVED A FAX FROM [facility] CONCERNING A CONFIRMATION OF PRESCRIPTION SHIPMENT FOR CAPECITABINE ON 03/16/13.

## 2013-03-17 NOTE — Patient Instructions (Signed)
[facility] Discharge Instructions for Patients Receiving Chemotherapy  Today you received the following chemotherapy agents Avastin.  To help prevent nausea and vomiting after your treatment, we encourage you to take your nausea medication as prescribed.   If you develop nausea and vomiting that is not controlled by your nausea medication, call the clinic.   BELOW ARE SYMPTOMS THAT SHOULD BE REPORTED IMMEDIATELY:  *FEVER GREATER THAN 100.5 F  *CHILLS WITH OR WITHOUT FEVER  NAUSEA AND VOMITING THAT IS NOT CONTROLLED WITH YOUR NAUSEA MEDICATION  *UNUSUAL SHORTNESS OF BREATH  *UNUSUAL BRUISING OR BLEEDING  TENDERNESS IN MOUTH AND THROAT WITH OR WITHOUT PRESENCE OF ULCERS  *URINARY PROBLEMS  *BOWEL PROBLEMS  UNUSUAL RASH Items with * indicate a potential emergency and should be followed up as soon as possible.  Feel free to call the clinic you have any questions or concerns. The clinic phone number is [phone].    

## 2013-03-19 NOTE — Progress Notes (Signed)
Christopher Fuentes  Telephone:(336) [phone] Fax:(336) [phone]  OFFICE PROGRESS NOTE   ID: Christopher Fuentes   DOB: 1960/04/25  MR#: [id number]  PQZ#:[id number]   PCP: No PCP Per [facility]:  Dr. Joline Salt   DIAGNOSIS: Christopher Fuentes is a 53 y.o. gentleman with metastatic colon carcinoma, originally was diagnosed in [facility].   PRIOR THERAPY: #1 In December 2013 the patient developed weeks of mild abdominal pain and decreased appetite with nausea and vomiting.  A CT in the ER revealed inflammatory colon mass at the splenic flexure with obvious metastasis to the liver.  In 01/2012 he underwent surgery for a large bowel obstruction which consisted of a partial colectomy with site to site functional and end to end staple anastomosis. Pathology revealed 6.0 cm tumor, invasive adenocarcinoma, low grade,  no lymphovascular invasion or perineural invasion. 0 of 15 lymph nodes were positive,  margins were negative. Pathologic stage T3 N0.   DNA MMR staining by immunohistochemistry was intact K.RAS mutation not found at exon 1 & 2 (including codone 12, 13, 14, 61).  #18 February 2012 CEA level was elevated at 120.    #3 In February 2014: CT chest, abdomen/pelvis revealed multiple bilateral lung masses,  at least 5 lesions in each lung, 2 cm lesion noted in segment of liver, multiple small mesenteric lymph nodes which were stable and not pathologically enlarged no bony lesions.   #4 In February 2014 a right liver biopsy of segment 5 showed metastatic adenocarcinoma consistent with colon origin, positive for CK 20 CDX2 and villin negative for PSA TTF-1 and CK 7.   #5 In  February 2014 adjuvant chemotherapy consisting of modified FOLFOX 6+ Avastin chemotherapy was started.  #6 In April 2014 CEA level 35 after third cycle of treatment.   CT chest, abdomen/pelvis: Stable bilateral subcentimeter pulmonary nodules mild decrease in left lower lobe nodule decrease in size of  lesion within the right hepatic lobe no new lesions seen   #22 Jun 2012 CEA level 11.6   #24 July 2012: Completed eighth cycle of treatment with modified FOLFOX 6+ Avastin chemotherapy  #9  Completed 12 cycles of modified FOLFOX/Avastin with day 3 Neulasta on 12/05/2012.  #10 Restaging scan on 03/01/13 revealed 1. Today's study demonstrates at least 2 discrete foci of hypermetabolism in the liver, suspicious for metastatic disease. In addition, there are numerous bilateral pulmonary nodules. These pulmonary nodules are all subcentimeter in size, with the largest nodules measuring 8 mm in the left upper lobe and 7 mm in the right upper lobe, none of which demonstrate definite hypermetabolism on the PET portion of the examination. Despite the lack of hypermetabolism, these findings are highly concerning for pulmonary metastases (likely below the resolution of PET imaging). Attention on followup studies is recommended.  Status post partial colectomy at the splenic flexure, without findings to suggest local recurrence of disease  CURRENT THERAPY:  Maintenance avastin q 3 weeks with xeloda days 1 -14 on a 21 day cycle   INTERVAL HISTORY: Christopher Fuentes is a 53 y.o. male who returns for followup of metastatic colon cancer. He has restaging scan and the results are as above. He overall feels well, no fevers or chills, no bleeding, no nausea or vomiting.  MEDICAL HISTORY: Past Medical History  Diagnosis Date  . Colon cancer 01/2012    stage IV   . 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 Hypertension 11/18/2012    ALLERGIES:  Allergies  Allergen Reactions  . Other Hives and Swelling    Sea food  . Shellfish Allergy     MEDICATIONS:  Current Outpatient Prescriptions  Medication Sig Dispense Refill  . losartan-hydrochlorothiazide (HYZAAR) 100-25 MG per tablet Take 1 tablet by mouth daily.  30 tablet  6  . capecitabine (XELODA) 500 MG tablet Take 5 tablets (2,500 mg total) by  mouth 2 (two) times daily after a meal. Take on days 1-14. Repeat every 21 days.  112 tablet  6  . clindamycin (CLEOCIN-T) 1 % lotion Apply topically 2 (two) times daily.  60 mL  1  . gabapentin (NEURONTIN) 100 MG capsule Take 1 capsule (100 mg total) by mouth 3 (three) times daily.  90 capsule  2  . LORazepam (ATIVAN) 0.5 MG tablet Take 1 tablet (0.5 mg total) by mouth every 6 (six) hours as needed (Nausea or vomiting).  30 tablet  0  . ondansetron (ZOFRAN) 8 MG tablet Take 1 tablet (8 mg total) by mouth 2 (two) times daily as needed (Nausea or vomiting).  30 tablet  1  . prochlorperazine (COMPAZINE) 10 MG tablet Take 1 tablet (10 mg total) by mouth every 6 (six) hours as needed (Nausea or vomiting).  30 tablet  1   No current facility-administered medications for this visit.    SURGICAL HISTORY:  Past Surgical History  Procedure Laterality Date  . Colon resection  01/2012    REVIEW OF SYSTEMS:  A 10 point review of systems was completed and is negative except as noted above.      PHYSICAL EXAMINATION: Blood pressure 145/84, pulse 71, temperature 98.1 F (36.7 C), temperature source Oral, resp. rate 18, height 6' 1"  (1.854 m), weight 215 lb 11.2 oz (97.841 kg). Body mass index is 28.46 kg/(m^2). General: Patient is a well appearing male in no acute distress HEENT: PERRLA, sclerae anicteric no conjunctival pallor, MMM Neck: supple, no palpable adenopathy Lungs: clear to auscultation bilaterally, no wheezes, rhonchi, or rales Cardiovascular: regular rate rhythm, S1, S2, no murmurs, rubs or gallops Abdomen: Soft, non-tender, non-distended, normoactive bowel sounds, no HSM Extremities: warm and well perfused, no clubbing, cyanosis, or edema Skin: No rashes or lesions Neuro: Non-focal ECOG PERFORMANCE STATUS: 1 - Symptomatic but completely ambulatory  LABORATORY DATA: Lab Results  Component Value Date   WBC 3.1* 03/17/2013   HGB 12.2* 03/17/2013   HCT 37.8* 03/17/2013   MCV 86.5  03/17/2013   PLT 158 03/17/2013      Chemistry      Component Value Date/Time   NA 142 03/17/2013 0916   K 4.0 03/17/2013 0916   CO2 25 03/17/2013 0916   BUN 16.3 03/17/2013 0916   CREATININE 1.3 03/17/2013 0916      Component Value Date/Time   CALCIUM 9.7 03/17/2013 0916   ALKPHOS 55 03/17/2013 0916   AST 31 03/17/2013 0916   ALT 25 03/17/2013 0916   BILITOT 0.39 03/17/2013 0916       RADIOGRAPHIC STUDIES: No results found.  ASSESSMENT/PLAN: Medhansh Brinkmeier is a 53 y.o. gentleman with:  #1 Metastatic colon carcinoma with liver and lung metastasis. Patient received palliative chemotherapy with modified FOLFOX 6 and Avastin x 12 cycles from 03/2012 through 12/05/2012. A restaging PET scan has been ordered for Christopher Fuentes.   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   #3 proceed with maintenance chemotherapy with xeloda day 1-14 and  avastin q 21 days. Discussed the rationa and side effects. We will start this on 03/17/13  All questions answered. Christopher Fuentes was encouraged to contact us in the interim with any questions, concerns, or problems.   I spent 20 minutes counseling the patient face to face.  The total time spent in the appointment was 30 minutes.  Marcy Panning, MD Medical/Oncology [facility] [phone] (beeper) [phone] (Office)  03/19/2013, 7:05 PM

## 2013-03-24 ENCOUNTER — Other Ambulatory Visit: Payer: Self-pay | Admitting: *Deleted

## 2013-03-24 ENCOUNTER — Ambulatory Visit (HOSPITAL_BASED_OUTPATIENT_CLINIC_OR_DEPARTMENT_OTHER): Payer: 59 | Admitting: Oncology

## 2013-03-24 ENCOUNTER — Encounter: Payer: Self-pay | Admitting: Oncology

## 2013-03-24 ENCOUNTER — Other Ambulatory Visit (HOSPITAL_BASED_OUTPATIENT_CLINIC_OR_DEPARTMENT_OTHER): Payer: 59

## 2013-03-24 VITALS — BP 144/87 | HR 55 | Temp 98.1°F | Resp 18 | Ht 73.0 in | Wt 218.2 lb

## 2013-03-24 DIAGNOSIS — C185 Malignant neoplasm of splenic flexure: Secondary | ICD-10-CM

## 2013-03-24 DIAGNOSIS — C189 Malignant neoplasm of colon, unspecified: Secondary | ICD-10-CM

## 2013-03-24 DIAGNOSIS — G609 Hereditary and idiopathic neuropathy, unspecified: Secondary | ICD-10-CM

## 2013-03-24 DIAGNOSIS — C7800 Secondary malignant neoplasm of unspecified lung: Secondary | ICD-10-CM

## 2013-03-24 DIAGNOSIS — C787 Secondary malignant neoplasm of liver and intrahepatic bile duct: Secondary | ICD-10-CM

## 2013-03-24 DIAGNOSIS — D702 Other drug-induced agranulocytosis: Secondary | ICD-10-CM

## 2013-03-24 LAB — CBC WITH DIFFERENTIAL/PLATELET
BASO%: 0.8 % (ref 0.0–2.0)
Basophils Absolute: 0 10*3/uL (ref 0.0–0.1)
EOS%: 3 % (ref 0.0–7.0)
Eosinophils Absolute: 0.1 10*3/uL (ref 0.0–0.5)
HCT: 40.1 % (ref 38.4–49.9)
HGB: 13.2 g/dL (ref 13.0–17.1)
LYMPH#: 1.4 10*3/uL (ref 0.9–3.3)
LYMPH%: 48 % (ref 14.0–49.0)
MCH: 27.9 pg (ref 27.2–33.4)
MCHC: 32.8 g/dL (ref 32.0–36.0)
MCV: 85 fL (ref 79.3–98.0)
MONO#: 0.3 10*3/uL (ref 0.1–0.9)
MONO%: 12 % (ref 0.0–14.0)
NEUT#: 1 10*3/uL — ABNORMAL LOW (ref 1.5–6.5)
NEUT%: 36.2 % — ABNORMAL LOW (ref 39.0–75.0)
Platelets: 165 10*3/uL (ref 140–400)
RBC: 4.72 10*6/uL (ref 4.20–5.82)
RDW: 14.2 % (ref 11.0–14.6)
WBC: 2.8 10*3/uL — ABNORMAL LOW (ref 4.0–10.3)

## 2013-03-24 LAB — COMPREHENSIVE METABOLIC PANEL (CC13)
ALT: 25 U/L (ref 0–55)
AST: 38 U/L — AB (ref 5–34)
Albumin: 3.9 g/dL (ref 3.5–5.0)
Alkaline Phosphatase: 60 U/L (ref 40–150)
Anion Gap: 11 mEq/L (ref 3–11)
BUN: 17.5 mg/dL (ref 7.0–26.0)
CALCIUM: 10 mg/dL (ref 8.4–10.4)
CHLORIDE: 106 meq/L (ref 98–109)
CO2: 24 mEq/L (ref 22–29)
Creatinine: 1.4 mg/dL — ABNORMAL HIGH (ref 0.7–1.3)
Glucose: 116 mg/dl (ref 70–140)
POTASSIUM: 3.8 meq/L (ref 3.5–5.1)
SODIUM: 141 meq/L (ref 136–145)
TOTAL PROTEIN: 7.6 g/dL (ref 6.4–8.3)
Total Bilirubin: 0.69 mg/dL (ref 0.20–1.20)

## 2013-03-24 LAB — UA PROTEIN, DIPSTICK - CHCC: Protein, ur: NEGATIVE mg/dL

## 2013-03-24 MED ORDER — CLINDAMYCIN PHOSPHATE 1 % EX LOTN: TOPICAL_LOTION | Freq: Two times a day (BID) | CUTANEOUS | Status: DC

## 2013-03-24 MED ORDER — GABAPENTIN 100 MG PO CAPS: 100.0000 mg | ORAL_CAPSULE | Freq: Three times a day (TID) | ORAL | Status: DC

## 2013-03-24 MED ORDER — CIPROFLOXACIN HCL 500 MG PO TABS: 500.0000 mg | ORAL_TABLET | Freq: Two times a day (BID) | ORAL | Status: DC

## 2013-03-24 NOTE — Progress Notes (Signed)
[place]  Telephone:(336) [phone] Fax:(336) [phone]  OFFICE PROGRESS NOTE   ID: Christopher Fuentes   DOB: February 07, 1961  MR#: [id number]  HLK#:[id number]   PCP: No PCP Per [facility]CP [facility]:  Dr. Joline Salt   DIAGNOSIS: Christopher Fuentes is a 53 y.o. gentleman with metastatic colon carcinoma, originally was diagnosed in [facility].   PRIOR THERAPY: #1 In December 2013 the patient developed weeks of mild abdominal pain and decreased appetite with nausea and vomiting.  A CT in the ER revealed inflammatory colon mass at the splenic flexure with obvious metastasis to the liver.  In 01/2012 he underwent surgery for a large bowel obstruction which consisted of a partial colectomy with site to site functional and end to end staple anastomosis. Pathology revealed 6.0 cm tumor, invasive adenocarcinoma, low grade,  no lymphovascular invasion or perineural invasion. 0 of 15 lymph nodes were positive,  margins were negative. Pathologic stage T3 N0.   DNA MMR staining by immunohistochemistry was intact K.RAS mutation not found at exon 1 & 2 (including codone 12, 13, 14, 61).  #18 February 2012 CEA level was elevated at 120.    #3 In February 2014: CT chest, abdomen/pelvis revealed multiple bilateral lung masses,  at least 5 lesions in each lung, 2 cm lesion noted in segment of liver, multiple small mesenteric lymph nodes which were stable and not pathologically enlarged no bony lesions.   #4 In February 2014 a right liver biopsy of segment 5 showed metastatic adenocarcinoma consistent with colon origin, positive for CK 20 CDX2 and villin negative for PSA TTF-1 and CK 7.   #5 In  February 2014 adjuvant chemotherapy consisting of modified FOLFOX 6+ Avastin chemotherapy was started.  #6 In April 2014 CEA level 35 after third cycle of treatment.   CT chest, abdomen/pelvis: Stable bilateral subcentimeter pulmonary nodules mild decrease in left lower lobe nodule decrease in size of  lesion within the right hepatic lobe no new lesions seen   #22 Jun 2012 CEA level 11.6   #24 July 2012: Completed eighth cycle of treatment with modified FOLFOX 6+ Avastin chemotherapy  #9  Completed 12 cycles of modified FOLFOX/Avastin with day 3 Neulasta on 12/05/2012.  #10 Restaging scan on 03/01/13 revealed 1. Today's study demonstrates at least 2 discrete foci of hypermetabolism in the liver, suspicious for metastatic disease. In addition, there are numerous bilateral pulmonary nodules. These pulmonary nodules are all subcentimeter in size, with the largest nodules measuring 8 mm in the left upper lobe and 7 mm in the right upper lobe, none of which demonstrate definite hypermetabolism on the PET portion of the examination. Despite the lack of hypermetabolism, these findings are highly concerning for pulmonary metastases (likely below the resolution of PET imaging). Attention on followup studies is recommended.  Status post partial colectomy at the splenic flexure, without findings to suggest local recurrence of disease  CURRENT THERAPY:  Maintenance avastin q 3 weeks with xeloda days 1 -14 on a 21 day cycle (day 8)  INTERVAL HISTORY: Christopher Fuentes is a 53 y.o. male who returns for followup of metastatic colon cancer.  He is tolerating the Xeloda very nicely. He is neutropenic. We did discuss neutropenic precautions. He denies any fevers chills night sweats headaches he has no shortness of breath no chest pains or palpitations. He denies any bleeding problems. He has no diarrhea or constipation no sores in the mouth no rashes. Remainder of the 10 point review of systems  is negative.  MEDICAL HISTORY: Past Medical History  Diagnosis Date  . Colon cancer 01/2012    stage IV   . 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 Hypertension 11/18/2012    ALLERGIES:   Allergies  Allergen Reactions  . Other Hives and Swelling    Sea food  . Shellfish Allergy     MEDICATIONS:   Current Outpatient Prescriptions  Medication Sig Dispense Refill  . capecitabine (XELODA) 500 MG tablet Take 5 tablets (2,500 mg total) by mouth 2 (two) times daily after a meal. Take on days 1-14. Repeat every 21 days.  112 tablet  6  . clindamycin (CLEOCIN-T) 1 % lotion Apply topically 2 (two) times daily.  60 mL  1  . gabapentin (NEURONTIN) 100 MG capsule Take 1 capsule (100 mg total) by mouth 3 (three) times daily.  90 capsule  2  . LORazepam (ATIVAN) 0.5 MG tablet Take 1 tablet (0.5 mg total) by mouth every 6 (six) hours as needed (Nausea or vomiting).  30 tablet  0  . losartan-hydrochlorothiazide (HYZAAR) 100-25 MG per tablet Take 1 tablet by mouth daily.  30 tablet  6  . ondansetron (ZOFRAN) 8 MG tablet Take 1 tablet (8 mg total) by mouth 2 (two) times daily as needed (Nausea or vomiting).  30 tablet  1  . prochlorperazine (COMPAZINE) 10 MG tablet Take 1 tablet (10 mg total) by mouth every 6 (six) hours as needed (Nausea or vomiting).  30 tablet  1   No current facility-administered medications for this visit.    SURGICAL HISTORY:  Past Surgical History  Procedure Laterality Date  . Colon resection  01/2012    REVIEW OF SYSTEMS:  A 10 point review of systems was completed and is negative except as noted above.      PHYSICAL EXAMINATION: Blood pressure 144/87, pulse 55, temperature 98.1 F (36.7 C), temperature source Oral, resp. rate 18, height _0  (1.854 m), weight 218 lb 3.2 oz (98.975 kg). Body mass index is 28.79 kg/(m^2). General: Patient is a well appearing male in no acute distress HEENT: PERRLA, sclerae anicteric no conjunctival pallor, MMM Neck: supple, no palpable adenopathy Lungs: clear to auscultation bilaterally, no wheezes, rhonchi, or rales Cardiovascular: regular rate rhythm, S1, S2, no murmurs, rubs or gallops Abdomen: Soft, non-tender, non-distended, normoactive bowel sounds, no HSM Extremities: warm and well perfused, no clubbing, cyanosis, or  edema Skin: No rashes or lesions Neuro: Non-focal ECOG PERFORMANCE STATUS: 1 - Symptomatic but completely ambulatory  LABORATORY DATA: Lab Results  Component Value Date   WBC 2.8* 03/24/2013   HGB 13.2 03/24/2013   HCT 40.1 03/24/2013   MCV 85.0 03/24/2013   PLT 165 03/24/2013      Chemistry      Component Value Date/Time   NA 142 03/17/2013 0916   K 4.0 03/17/2013 0916   CO2 25 03/17/2013 0916   BUN 16.3 03/17/2013 0916   CREATININE 1.3 03/17/2013 0916      Component Value Date/Time   CALCIUM 9.7 03/17/2013 0916   ALKPHOS 55 03/17/2013 0916   AST 31 03/17/2013 0916   ALT 25 03/17/2013 0916   BILITOT 0.39 03/17/2013 0916       RADIOGRAPHIC STUDIES: No results found.  ASSESSMENT/PLAN: Christopher Fuentes is a 53 y.o. gentleman with:  #1 Metastatic colon carcinoma with liver and lung metastasis. Patient received palliative chemotherapy with modified FOLFOX 6 and Avastin x 12 cycles from 03/2012 through 12/05/2012. A restaging PET scan has been  ordered for Christopher Fuentes.   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 Refills were given today.  #3 maintenance chemotherapy with xeloda day 1-14 and avastin q 21 days.: Patient received Avastin on 03/17/2013. He is currently receiving Xeloda daily he will finish out 14 days on February 12. He is tolerating it very well without any problems.  #4 neutropenia: Secondary to chemotherapy I will begin him on prophylactic antibiotics.  #5 patient will be seen back on 04/07/2013 in followup as well as to proceed with cycle 2 of his treatment.  All questions answered. Christopher Fuentes was encouraged to contact us in the interim with any questions, concerns, or problems.   I spent 20 minutes counseling the patient face to face.  The total time spent in the appointment was 30 minutes.  Marcy Panning, MD Medical/Oncology [facility] [phone]  (beeper) [phone] (Office)  03/24/2013, 8:42 AM

## 2013-03-24 NOTE — Patient Instructions (Signed)
Neutropenia Neutropenia is a condition that occurs when the level of a certain type of white blood cell (neutrophil) in your body becomes lower than normal. Neutrophils are made in the bone marrow and fight infections. These cells protect against bacteria and viruses. The fewer neutrophils you have, and the longer your body remains without them, the greater your risk of getting a severe infection becomes. CAUSES  The cause of neutropenia may be hard to determine. However, it is usually due to 3 main problems:   Decreased production of neutrophils. This may be due to:  Certain medicines such as chemotherapy.  Genetic problems.  Cancer.  Radiation treatments.  Vitamin deficiency.  Some pesticides.  Increased destruction of neutrophils. This may be due to:  Overwhelming infections.  Hemolytic anemia. This is when the body destroys its own blood cells.  Chemotherapy.  Neutrophils moving to areas of the body where they cannot fight infections. This may be due to:  Dialysis procedures.  Conditions where the spleen becomes enlarged. Neutrophils are held in the spleen and are not available to the rest of the body.  Overwhelming infections. The neutrophils are held in the area of the infection and are not available to the rest of the body. SYMPTOMS  There are no specific symptoms of neutropenia. The lack of neutrophils can result in an infection, and an infection can cause various problems. DIAGNOSIS  Diagnosis is made by a blood test. A complete blood count is performed. The normal level of neutrophils in human blood differs with age and race. Infants have lower counts than older children and adults. African Americans have lower counts than Caucasians or Asians. The average adult level is 1500 cells/mm3 of blood. Neutrophil counts are interpreted as follows:  Greater than 1000 cells/mm3 gives normal protection against infection.  500 to 1000 cells/mm3 gives an increased risk for  infection.  200 to 500 cells/mm3 is a greater risk for severe infection.  Lower than 200 cells/mm3 is a marked risk of infection. This may require hospitalization and treatment with antibiotic medicines. TREATMENT  Treatment depends on the underlying cause, severity, and presence of infections or symptoms. It also depends on your health. Your caregiver will discuss the treatment plan with you. Mild cases are often easily treated and have a good outcome. Preventative measures may also be started to limit your risk of infections. Treatment can include:  Taking antibiotics.  Stopping medicines that are known to cause neutropenia.  Correcting nutritional deficiencies by eating green vegetables to supply folic acid and taking vitamin B supplements.  Stopping exposure to pesticides if your neutropenia is related to pesticide exposure.  Taking a blood growth factor called sargramostim, pegfilgrastim, or filgrastim if you are undergoing chemotherapy for cancer. This stimulates white blood cell production.  Removal of the spleen if you have Felty's syndrome and have repeated infections. HOME CARE INSTRUCTIONS   Follow your caregiver's instructions about when you need to have blood work done.  Wash your hands often. Make sure others who come in contact with you also wash their hands.  Wash raw fruits and vegetables before eating them. They can carry bacteria and fungi.  Avoid people with colds or spreadable (contagious) diseases (chickenpox, herpes zoster, influenza).  Avoid large crowds.  Avoid construction areas. The dust can release fungus into the air.  Be cautious around children in daycare or school environments.  Take care of your respiratory system by coughing and deep breathing.  Bathe daily.  Protect your skin from cuts and  burns.  Do not work in the garden or with flowers and plants.  Care for the mouth before and after meals by brushing with a soft toothbrush. If you have  mucositis, do not use mouthwash. Mouthwash contains alcohol and can dry out the mouth even more.  Clean the area between the genitals and the anus (perineal area) after urination and bowel movements. Women need to wipe from front to back.  Use a water soluble lubricant during sexual intercourse and practice good hygiene after. Do not have intercourse if you are severely neutropenic. Check with your caregiver for guidelines.  Exercise daily as tolerated.  Avoid people who were vaccinated with a live vaccine in the past 30 days. You should not receive live vaccines (polio, typhoid).  Do not provide direct care for pets. Avoid animal droppings. Do not clean litter boxes and bird cages.  Do not share food utensils.  Do not use tampons, enemas, or rectal suppositories unless directed by your caregiver.  Use an electric razor to remove hair.  Wash your hands after handling magazines, letters, and newspapers. SEEK IMMEDIATE MEDICAL CARE IF:   You have a fever.  You have chills or start to shake.  You feel nauseous or vomit.  You develop mouth sores.  You develop aches and pains.  You have redness and swelling around open wounds.  Your skin is warm to the touch.  You have pus coming from your wounds.  You develop swollen lymph nodes.  You feel weak or fatigued.  You develop red streaks on the skin. MAKE SURE YOU:  Understand these instructions.  Will watch your condition.  Will get help right away if you are not doing well or get worse. Document Released: 07/25/2001 Document Revised: 04/27/2011 Document Reviewed: 08/22/2010 [facility] Patient Information 2014 [place], [place].  Tumor Markers Tumor markers are substances, usually proteins, that are produced by the body in response to cancer growth or by the cancer tissue itself. Some tumor markers are specific for one type of cancer, while others are seen in several cancer types. Many of the well-known markers are seen in  non-cancerous conditions as well as in cancer. Consequently, these tumor markers are not diagnostic for cancer.  WHY ARE THEY DONE?  Tumor markers are not diagnostic in themselves. A definitive diagnosis of cancer is made by looking at biopsy specimens (for example, of tissue) under a microscope. Tumor markers provide information that can be used to:   Screen - Most markers are not suited for general screening, but some may be used in those with a strong family history of a particular cancer. In the case of genetic markers, they may be used to help predict risk in family members. PSA testing for prostate cancer is an example.  Help diagnose - In a patient that has symptoms, tumor markers may be used to help identify the source of the cancer, such as CA-125 for ovarian cancer, and to help tell it apart from other conditions. Tumor markers can not diagnose cancer themselves but aid in this process.  Stage - If a patient does have cancer, tumor marker elevations can be used to help determine how far the cancer has spread into other tissues and organs.  Determine prognosis - Some tumor markers can be used to help caregivers determine how aggressive a cancer is likely to be.  Guide Treatment - Some tumor markers, such as Her2/neu, will give caregivers information about what treatments their patients may respond to (for instance, breast cancer  patients who are Her2/neu positive are more likely to respond to Herceptin therapeutic drug treatment).  Monitor Treatment - Tumor markers can be used to monitor the effectiveness of treatment, especially in advanced cancers. If the marker level drops, the treatment is working; if it stays elevated, adjustments are needed. The information must be used with care, however. CEA, for instance, is used to monitor colorectal cancer, but not every colorectal cancer patient will have elevated levels of CEA. If the marker level is not initially elevated with the cancer, it  cannot be used later as a monitoring tool.  Determine recurrence - Currently, one of the biggest uses for tumor markers is to monitor for cancer recurrence. If a tumor marker is elevated before treatment, low after treatment, and then begins to rise over time, then it is likely that the cancer is returning. (If it remains elevated after surgery, then chances are that not all of the cancer was removed. COMMON TUMOR MARKERS CURRENTLY IN USE AFP (Alpha-feto protein)   Cancers: Liver, germ cell cancer of ovaries or testes.  What else? Also elevated during pregnancy.  When/How Used: Help diagnose, monitor treatment, and determine recurrence.  Sample: Blood. CA 15-3 (Cancer antigen 15-3)   Cancers: Breast and others including lung, ovarian.  What else? Also elevated in benign breast conditions; caregiver can use CA 15-3 or CA 27.29 (two different assays for same marker).  When/How Used: Stage disease, monitor treatment, and determine recurrence.  Sample: Blood. CA 19-9 (Cancer antigen 19-9)  Cancers: Pancreatic, sometimes colorectal and bile ducts.  What else? Also elevated in pancreatitis and inflammatory bowel disease.  When/How Used: Stage disease, monitor treatment, and determine recurrence.  Sample: Blood. CA-125 (Cancer antigen 125)  Cancers: Ovarian.  What else? Also elevated with endo-metriosis, some other diseases and benign conditions; not recommended as a general screen.  When/How Used: Help diagnose, monitor treatment, and determine recurrence.  Sample: Blood. CEA (Carcino-embryonic antigen)   Cancers: Colorectal, lung, breast, thyroid, pancreatic, liver, cervix, and bladder.  What else? Elevated in other conditions such as hepatitis, COPD, colitis, pancreatitis, and in cigarette smokers .  When/How Used: Monitor treatment and determine recurrence.  Sample: Blood. Estrogen receptors  Cancers: Breast.  What else? Increased in hormone- dependent  cancer.  When/How Used: Determine prognosis and guide treatment.  Sample: Tissue. hCG (Human chorionic gonadotropin)  Cancers: Testicular and trophoblastic.  What else? Elevated in pregnancy, testicular failure.  When/How Used: Help diagnose, monitor treatment, and determine recurrence.  Sample: Blood, urine. Her-2/neu  Cancers: Breast.  What else? Oncogene that is present in multiple copies in 20% to 30% of invasive breast cancer.  When/How Used: Determine prognosis and guide treatment.  Sample: Tissue. Monoclonal immunoglobulins  Cancers: Multiple myeloma and Waldenstrom's macroglobulinemia.  What else? Overproduction of an immunoglobulin or antibody, usually detected by protein electrophoresis.  When/How Used: Help diagnose, monitor treatment, and determine recurrence.  Sample: Blood, urine. Progesterone receptors  Cancers: Breast.  What else? Increased in hormone-dependent cancer.  When/How Used: Determine prognosis and guide treatment.  Sample: Tissue. PSA (Prostate specific antigen), total and free  Cancers: Prostate.  What else? Elevated in benign prostatic hypertrophy, prostatitis and with age.  When/How Used: Screen for and help diagnose, monitor treatment, and determine recurrence.  Sample: Blood. Document Released: 02/20/2004 Document Revised: 04/27/2011 Document Reviewed: 11/12/2004 [facility] Patient Information 2014 [place].

## 2013-03-31 ENCOUNTER — Other Ambulatory Visit: Payer: Self-pay

## 2013-04-06 ENCOUNTER — Encounter: Payer: Self-pay | Admitting: *Deleted

## 2013-04-06 NOTE — Progress Notes (Signed)
RECEIVED A FAX FROM [employer] CONCERNING A CONFIRMATION OF PRESCRIPTION SHIPMENT FOR CAPECITABINE ON 04/05/13.

## 2013-04-07 ENCOUNTER — Ambulatory Visit (HOSPITAL_BASED_OUTPATIENT_CLINIC_OR_DEPARTMENT_OTHER): Payer: 59 | Admitting: Oncology

## 2013-04-07 ENCOUNTER — Other Ambulatory Visit (HOSPITAL_BASED_OUTPATIENT_CLINIC_OR_DEPARTMENT_OTHER): Payer: 59

## 2013-04-07 ENCOUNTER — Telehealth: Payer: Self-pay | Admitting: *Deleted

## 2013-04-07 ENCOUNTER — Ambulatory Visit (HOSPITAL_BASED_OUTPATIENT_CLINIC_OR_DEPARTMENT_OTHER): Payer: 59

## 2013-04-07 VITALS — BP 146/83 | HR 65 | Temp 97.9°F | Resp 20 | Ht 73.0 in | Wt 215.5 lb

## 2013-04-07 DIAGNOSIS — C787 Secondary malignant neoplasm of liver and intrahepatic bile duct: Secondary | ICD-10-CM

## 2013-04-07 DIAGNOSIS — C185 Malignant neoplasm of splenic flexure: Secondary | ICD-10-CM

## 2013-04-07 DIAGNOSIS — C189 Malignant neoplasm of colon, unspecified: Secondary | ICD-10-CM

## 2013-04-07 DIAGNOSIS — D702 Other drug-induced agranulocytosis: Secondary | ICD-10-CM

## 2013-04-07 DIAGNOSIS — R918 Other nonspecific abnormal finding of lung field: Secondary | ICD-10-CM

## 2013-04-07 DIAGNOSIS — Z5112 Encounter for antineoplastic immunotherapy: Secondary | ICD-10-CM

## 2013-04-07 DIAGNOSIS — G609 Hereditary and idiopathic neuropathy, unspecified: Secondary | ICD-10-CM

## 2013-04-07 LAB — CBC WITH DIFFERENTIAL/PLATELET
BASO%: 0.6 % (ref 0.0–2.0)
Basophils Absolute: 0 10*3/uL (ref 0.0–0.1)
EOS ABS: 0.2 10*3/uL (ref 0.0–0.5)
EOS%: 6.3 % (ref 0.0–7.0)
HCT: 39.4 % (ref 38.4–49.9)
HGB: 13 g/dL (ref 13.0–17.1)
LYMPH%: 44.9 % (ref 14.0–49.0)
MCH: 27.6 pg (ref 27.2–33.4)
MCHC: 33 g/dL (ref 32.0–36.0)
MCV: 83.7 fL (ref 79.3–98.0)
MONO#: 0.6 10*3/uL (ref 0.1–0.9)
MONO%: 15.2 % — AB (ref 0.0–14.0)
NEUT#: 1.2 10*3/uL — ABNORMAL LOW (ref 1.5–6.5)
NEUT%: 33 % — ABNORMAL LOW (ref 39.0–75.0)
PLATELETS: 160 10*3/uL (ref 140–400)
RBC: 4.7 10*6/uL (ref 4.20–5.82)
RDW: 13.7 % (ref 11.0–14.6)
WBC: 3.7 10*3/uL — ABNORMAL LOW (ref 4.0–10.3)
lymph#: 1.7 10*3/uL (ref 0.9–3.3)

## 2013-04-07 LAB — COMPREHENSIVE METABOLIC PANEL (CC13)
ALT: 16 U/L (ref 0–55)
AST: 40 U/L — ABNORMAL HIGH (ref 5–34)
Albumin: 4.1 g/dL (ref 3.5–5.0)
Alkaline Phosphatase: 65 U/L (ref 40–150)
Anion Gap: 12 mEq/L — ABNORMAL HIGH (ref 3–11)
BILIRUBIN TOTAL: 0.61 mg/dL (ref 0.20–1.20)
BUN: 17.7 mg/dL (ref 7.0–26.0)
CO2: 24 mEq/L (ref 22–29)
Calcium: 10 mg/dL (ref 8.4–10.4)
Chloride: 104 mEq/L (ref 98–109)
Creatinine: 1.4 mg/dL — ABNORMAL HIGH (ref 0.7–1.3)
GLUCOSE: 113 mg/dL (ref 70–140)
Potassium: 3.5 mEq/L (ref 3.5–5.1)
Sodium: 140 mEq/L (ref 136–145)
Total Protein: 7.8 g/dL (ref 6.4–8.3)

## 2013-04-07 LAB — UA PROTEIN, DIPSTICK - CHCC: Protein, ur: NEGATIVE mg/dL

## 2013-04-07 MED ORDER — SODIUM CHLORIDE 0.9 % IV SOLN
15.0000 mg/kg | Freq: Once | INTRAVENOUS | Status: AC
  Administered 2013-04-07: 1475 mg via INTRAVENOUS
  Filled 2013-04-07: qty 59

## 2013-04-07 MED ORDER — SODIUM CHLORIDE 0.9 % IJ SOLN
10.0000 mL | INTRAMUSCULAR | Status: DC | PRN
Start: 2013-04-07 — End: 2013-04-07
  Administered 2013-04-07: 10 mL
  Filled 2013-04-07: qty 10

## 2013-04-07 MED ORDER — HEPARIN SOD (PORK) LOCK FLUSH 100 UNIT/ML IV SOLN
500.0000 [IU] | Freq: Once | INTRAVENOUS | Status: AC | PRN
  Administered 2013-04-07: 500 [IU]
  Filled 2013-04-07: qty 5

## 2013-04-07 MED ORDER — CIPROFLOXACIN HCL 500 MG PO TABS: 500.0000 mg | ORAL_TABLET | Freq: Two times a day (BID) | ORAL | Status: DC

## 2013-04-07 MED ORDER — SODIUM CHLORIDE 0.9 % IV SOLN
Freq: Once | INTRAVENOUS | Status: AC
  Administered 2013-04-07: 10:00:00 via INTRAVENOUS

## 2013-04-07 NOTE — Progress Notes (Signed)
[place]  [phone] Fax:([phone]) [phone]  OFFICE PROGRESS NOTE   ID: Tayler Lassen   DOB: 1960/03/19  MR#: [id number]  PYK#:[id number]   PCP: No PCP Per [facility]:  Dr. Joline Salt   DIAGNOSIS: Jarone Ostergaard is a 53 y.o. gentleman with metastatic colon carcinoma, originally was diagnosed in [facility].   PRIOR THERAPY: #1 In December 2013 the patient developed weeks of mild abdominal pain and decreased appetite with nausea and vomiting.  A CT in the ER revealed inflammatory colon mass at the splenic flexure with obvious metastasis to the liver.  In 01/2012 he underwent surgery for a large bowel obstruction which consisted of a partial colectomy with site to site functional and end to end staple anastomosis. Pathology revealed 6.0 cm tumor, invasive adenocarcinoma, low grade,  no lymphovascular invasion or perineural invasion. 0 of 15 lymph nodes were positive,  margins were negative. Pathologic stage T3 N0.   DNA MMR staining by immunohistochemistry was intact K.RAS mutation not found at exon 1 & 2 (including codone 12, 13, 14, 61).  #18 February 2012 CEA level was elevated at 120.    #3 In February 2014: CT chest, abdomen/pelvis revealed multiple bilateral lung masses,  at least 5 lesions in each lung, 2 cm lesion noted in segment of liver, multiple small mesenteric lymph nodes which were stable and not pathologically enlarged no bony lesions.   #4 In February 2014 a right liver biopsy of segment 5 showed metastatic adenocarcinoma consistent with colon origin, positive for CK 20 CDX2 and villin negative for PSA TTF-1 and CK 7.   #5 In  February 2014 adjuvant chemotherapy consisting of modified FOLFOX 6+ Avastin chemotherapy was started.  #6 In April 2014 CEA level 35 after third cycle of treatment.   CT chest, abdomen/pelvis: Stable bilateral subcentimeter pulmonary nodules mild decrease in left lower lobe nodule decrease in size of  lesion within the right hepatic lobe no new lesions seen   #22 Jun 2012 CEA level 11.6   #24 July 2012: Completed eighth cycle of treatment with modified FOLFOX 6+ Avastin chemotherapy  #9  Completed 12 cycles of modified FOLFOX/Avastin with day 3 Neulasta on 12/05/2012.  #10 Restaging scan on 03/01/13 revealed 1. Today's study demonstrates at least 2 discrete foci of hypermetabolism in the liver, suspicious for metastatic disease. In addition, there are numerous bilateral pulmonary nodules. These pulmonary nodules are all subcentimeter in size, with the largest nodules measuring 8 mm in the left upper lobe and 7 mm in the right upper lobe, none of which demonstrate definite hypermetabolism on the PET portion of the examination. Despite the lack of hypermetabolism, these findings are highly concerning for pulmonary metastases (likely below the resolution of PET imaging). Attention on followup studies is recommended.  Status post partial colectomy at the splenic flexure, without findings to suggest local recurrence of disease  CURRENT THERAPY:  Proceed with cycle 2 avastin  with xeloda days 1 -14 on a 21 day cycle (2/20 - 3/5)  INTERVAL HISTORY: Romain Erion is a 53 y.o. male who returns for followup of metastatic colon cancer.  He is tolerating the Xeloda very nicely. He is neutropenic. We did discuss neutropenic precautions. He denies any fevers chills night sweats headaches he has no shortness of breath no chest pains or palpitations. He denies any bleeding problems. He has no diarrhea or constipation no sores in the mouth no rashes. Remainder of the 10 point review  of systems is negative.  MEDICAL HISTORY: Past Medical History  Diagnosis Date  . Colon cancer 01/2012    stage IV   . 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 Hypertension 11/18/2012    ALLERGIES:   Allergies  Allergen Reactions  . Other Hives and Swelling    Sea food  . Shellfish Allergy     MEDICATIONS:   Current Outpatient Prescriptions  Medication Sig Dispense Refill  . capecitabine (XELODA) 500 MG tablet Take 5 tablets (2,500 mg total) by mouth 2 (two) times daily after a meal. Take on days 1-14. Repeat every 21 days.  112 tablet  6  . ciprofloxacin (CIPRO) 500 MG tablet Take 1 tablet (500 mg total) by mouth 2 (two) times daily.  20 tablet  2  . clindamycin (CLEOCIN-T) 1 % lotion Apply topically 2 (two) times daily.  60 mL  1  . gabapentin (NEURONTIN) 100 MG capsule Take 1 capsule (100 mg total) by mouth 3 (three) times daily.  90 capsule  2  . LORazepam (ATIVAN) 0.5 MG tablet Take 1 tablet (0.5 mg total) by mouth every 6 (six) hours as needed (Nausea or vomiting).  30 tablet  0  . losartan-hydrochlorothiazide (HYZAAR) 100-25 MG per tablet Take 1 tablet by mouth daily.  30 tablet  6  . ondansetron (ZOFRAN) 8 MG tablet Take 1 tablet (8 mg total) by mouth 2 (two) times daily as needed (Nausea or vomiting).  30 tablet  1  . prochlorperazine (COMPAZINE) 10 MG tablet Take 1 tablet (10 mg total) by mouth every 6 (six) hours as needed (Nausea or vomiting).  30 tablet  1   No current facility-administered medications for this visit.    SURGICAL HISTORY:  Past Surgical History  Procedure Laterality Date  . Colon resection  01/2012    REVIEW OF SYSTEMS:  A 10 point review of systems was completed and is negative except as noted above.      PHYSICAL EXAMINATION: Blood pressure 146/83, pulse 65, temperature 97.9 F (36.6 C), temperature source Oral, resp. rate 20, height 6' 1"  (1.854 m), weight 215 lb 8 oz (97.75 kg). Body mass index is 28.44 kg/(m^2). General: Patient is a well appearing male in no acute distress HEENT: PERRLA, sclerae anicteric no conjunctival pallor, MMM Neck: supple, no palpable adenopathy Lungs: clear to auscultation bilaterally, no wheezes, rhonchi, or rales Cardiovascular: regular rate rhythm, S1, S2, no murmurs, rubs or gallops Abdomen: Soft, non-tender,  non-distended, normoactive bowel sounds, no HSM Extremities: warm and well perfused, no clubbing, cyanosis, or edema Skin: No rashes or lesions Neuro: Non-focal ECOG PERFORMANCE STATUS: 1 - Symptomatic but completely ambulatory  LABORATORY DATA: Lab Results  Component Value Date   WBC 3.7* 04/07/2013   HGB 13.0 04/07/2013   HCT 39.4 04/07/2013   MCV 83.7 04/07/2013   PLT 160 04/07/2013      Chemistry      Component Value Date/Time   NA 140 04/07/2013 0834   K 3.5 04/07/2013 0834   CO2 24 04/07/2013 0834   BUN 17.7 04/07/2013 0834   CREATININE 1.4* 04/07/2013 0834      Component Value Date/Time   CALCIUM 10.0 04/07/2013 0834   ALKPHOS 65 04/07/2013 0834   AST 40* 04/07/2013 0834   ALT 16 04/07/2013 0834   BILITOT 0.61 04/07/2013 0834       RADIOGRAPHIC STUDIES: No results found.  ASSESSMENT/PLAN: Telford Archambeau is a 53 y.o. gentleman with:  #1 Metastatic colon carcinoma with liver  and lung metastasis. Patient received palliative chemotherapy with modified FOLFOX 6 and Avastin x 12 cycles from 03/2012 through 12/05/2012. A restaging PET scan has been ordered for Mr. Hortman.   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 Refills were given today.  #3 maintenance chemotherapy with xeloda day 1-14 and avastin q 21 days.:proceed with Avastin cycle 2 day today. He is currently receiving Xeloda daily he will finish out 14 days on April 20, 2013. He is tolerating it very well without any problems.  #4 neutropenia: Secondary to chemotherapy ANC 1.2. I will begin him on prophylactic antibiotics.  #5 patient will be seen back on 04/28/2013 in followup as well as to proceed with cycle 3 of his treatment.  All questions answered. Mr. Goulette was encouraged to contact us in the interim with any questions, concerns, or problems.   I spent 20 minutes counseling the patient face to face.  The total time spent  in the appointment was 30 minutes.  Marcy Panning, MD Medical/Oncology [facility] [phone] (beeper) [phone] (Office)  04/07/2013, 9:56 AM

## 2013-04-07 NOTE — Patient Instructions (Signed)
[place] Discharge Instructions for Patients Receiving Chemotherapy  Today you received the following chemotherapy agents avastin.  To help prevent nausea and vomiting after your treatment, we encourage you to take your nausea medication zofran or compazine.   If you develop nausea and vomiting that is not controlled by your nausea medication, call the clinic.   BELOW ARE SYMPTOMS THAT SHOULD BE REPORTED IMMEDIATELY:  *FEVER GREATER THAN 100.5 F  *CHILLS WITH OR WITHOUT FEVER  NAUSEA AND VOMITING THAT IS NOT CONTROLLED WITH YOUR NAUSEA MEDICATION  *UNUSUAL SHORTNESS OF BREATH  *UNUSUAL BRUISING OR BLEEDING  TENDERNESS IN MOUTH AND THROAT WITH OR WITHOUT PRESENCE OF ULCERS  *URINARY PROBLEMS  *BOWEL PROBLEMS  UNUSUAL RASH Items with * indicate a potential emergency and should be followed up as soon as possible.  Feel free to call the clinic you have any questions or concerns. The clinic phone number is [phone].

## 2013-04-07 NOTE — Telephone Encounter (Signed)
appts made and printed...td 

## 2013-04-09 ENCOUNTER — Encounter: Payer: Self-pay | Admitting: Oncology

## 2013-04-11 ENCOUNTER — Other Ambulatory Visit: Payer: Self-pay | Admitting: Emergency Medicine

## 2013-04-11 ENCOUNTER — Telehealth: Payer: Self-pay | Admitting: Oncology

## 2013-04-11 DIAGNOSIS — C189 Malignant neoplasm of colon, unspecified: Secondary | ICD-10-CM

## 2013-04-11 DIAGNOSIS — C787 Secondary malignant neoplasm of liver and intrahepatic bile duct: Principal | ICD-10-CM

## 2013-04-11 MED ORDER — GABAPENTIN 100 MG PO CAPS: 100.0000 mg | ORAL_CAPSULE | Freq: Three times a day (TID) | ORAL | Status: DC

## 2013-04-11 NOTE — Telephone Encounter (Signed)
, °

## 2013-04-14 ENCOUNTER — Other Ambulatory Visit: Payer: 59

## 2013-04-14 ENCOUNTER — Ambulatory Visit (HOSPITAL_BASED_OUTPATIENT_CLINIC_OR_DEPARTMENT_OTHER): Payer: 59 | Admitting: Adult Health

## 2013-04-14 ENCOUNTER — Telehealth: Payer: Self-pay | Admitting: *Deleted

## 2013-04-14 ENCOUNTER — Other Ambulatory Visit: Payer: Self-pay

## 2013-04-14 ENCOUNTER — Encounter: Payer: Self-pay | Admitting: Adult Health

## 2013-04-14 ENCOUNTER — Other Ambulatory Visit (HOSPITAL_BASED_OUTPATIENT_CLINIC_OR_DEPARTMENT_OTHER): Payer: 59

## 2013-04-14 VITALS — BP 130/78 | HR 82 | Temp 98.0°F | Resp 18 | Ht 73.0 in | Wt 217.0 lb

## 2013-04-14 DIAGNOSIS — C787 Secondary malignant neoplasm of liver and intrahepatic bile duct: Secondary | ICD-10-CM

## 2013-04-14 DIAGNOSIS — D702 Other drug-induced agranulocytosis: Secondary | ICD-10-CM

## 2013-04-14 DIAGNOSIS — C185 Malignant neoplasm of splenic flexure: Secondary | ICD-10-CM

## 2013-04-14 DIAGNOSIS — C7800 Secondary malignant neoplasm of unspecified lung: Secondary | ICD-10-CM

## 2013-04-14 DIAGNOSIS — C189 Malignant neoplasm of colon, unspecified: Secondary | ICD-10-CM

## 2013-04-14 DIAGNOSIS — G609 Hereditary and idiopathic neuropathy, unspecified: Secondary | ICD-10-CM

## 2013-04-14 LAB — CBC WITH DIFFERENTIAL/PLATELET
BASO%: 1 % (ref 0.0–2.0)
Basophils Absolute: 0 10*3/uL (ref 0.0–0.1)
EOS ABS: 0.1 10*3/uL (ref 0.0–0.5)
EOS%: 5.9 % (ref 0.0–7.0)
HEMATOCRIT: 38.8 % (ref 38.4–49.9)
HGB: 12.9 g/dL — ABNORMAL LOW (ref 13.0–17.1)
LYMPH%: 52.5 % — AB (ref 14.0–49.0)
MCH: 28 pg (ref 27.2–33.4)
MCHC: 33.4 g/dL (ref 32.0–36.0)
MCV: 83.9 fL (ref 79.3–98.0)
MONO#: 0.3 10*3/uL (ref 0.1–0.9)
MONO%: 11.4 % (ref 0.0–14.0)
NEUT#: 0.7 10*3/uL — ABNORMAL LOW (ref 1.5–6.5)
NEUT%: 29.2 % — ABNORMAL LOW (ref 39.0–75.0)
PLATELETS: 183 10*3/uL (ref 140–400)
RBC: 4.62 10*6/uL (ref 4.20–5.82)
RDW: 13.6 % (ref 11.0–14.6)
WBC: 2.2 10*3/uL — AB (ref 4.0–10.3)
lymph#: 1.2 10*3/uL (ref 0.9–3.3)

## 2013-04-14 LAB — UA PROTEIN, DIPSTICK - CHCC: PROTEIN: NEGATIVE mg/dL

## 2013-04-14 LAB — COMPREHENSIVE METABOLIC PANEL (CC13)
ALT: 17 U/L (ref 0–55)
ANION GAP: 11 meq/L (ref 3–11)
AST: 32 U/L (ref 5–34)
Albumin: 3.9 g/dL (ref 3.5–5.0)
Alkaline Phosphatase: 64 U/L (ref 40–150)
BUN: 22.2 mg/dL (ref 7.0–26.0)
CO2: 24 meq/L (ref 22–29)
CREATININE: 1.4 mg/dL — AB (ref 0.7–1.3)
Calcium: 9.4 mg/dL (ref 8.4–10.4)
Chloride: 104 mEq/L (ref 98–109)
GLUCOSE: 116 mg/dL (ref 70–140)
Potassium: 3.9 mEq/L (ref 3.5–5.1)
Sodium: 139 mEq/L (ref 136–145)
TOTAL PROTEIN: 7.7 g/dL (ref 6.4–8.3)
Total Bilirubin: 0.98 mg/dL (ref 0.20–1.20)

## 2013-04-14 MED ORDER — CLINDAMYCIN PHOSPHATE 1 % EX LOTN: TOPICAL_LOTION | Freq: Two times a day (BID) | CUTANEOUS | Status: DC

## 2013-04-14 NOTE — Progress Notes (Signed)
Christopher Fuentes  [phone] [phone]) [phone]  OFFICE PROGRESS NOTE   ID: Christopher Fuentes   DOB: April 08, 1960  MR#: [id number]  EPP#:[id number]   PCP: No PCP [place] [facility] [place]:  Dr. Joline Salt   DIAGNOSIS: Christopher Fuentes is a 53 y.o. gentleman with metastatic colon carcinoma, originally was diagnosed in [facility].   PRIOR THERAPY: #1 In December 2013 the patient developed weeks of mild abdominal pain and decreased appetite with nausea and vomiting.  A CT in the ER revealed inflammatory colon mass at the splenic flexure with obvious metastasis to the liver.  In 01/2012 he underwent surgery for a large bowel obstruction which consisted of a partial colectomy with site to site functional and end to end staple anastomosis. Pathology revealed 6.0 cm tumor, invasive adenocarcinoma, low grade,  no lymphovascular invasion or perineural invasion. 0 of 15 lymph nodes were positive,  margins were negative. Pathologic stage T3 N0.   DNA MMR staining by immunohistochemistry was intact K.RAS mutation not found at exon 1 & 2 (including codone 12, 13, 14, 61).  #18 February 2012 CEA level was elevated at 120.    #3 In February 2014: CT chest, abdomen/pelvis revealed multiple bilateral lung masses,  at least 5 lesions in each lung, 2 cm lesion noted in segment of liver, multiple small mesenteric lymph nodes which were stable and not pathologically enlarged no bony lesions.   #4 In February 2014 a right liver biopsy of segment 5 showed metastatic adenocarcinoma consistent with colon origin, positive for CK 20 CDX2 and villin negative for PSA TTF-1 and CK 7.   #5 In  February 2014 adjuvant chemotherapy consisting of modified FOLFOX 6+ Avastin chemotherapy was started.  #6 In April 2014 CEA level 35 after third cycle of treatment.   CT chest, abdomen/pelvis: Stable bilateral subcentimeter pulmonary nodules mild decrease in left lower lobe nodule decrease in size of  lesion within the right hepatic lobe no new lesions seen   #22 Jun 2012 CEA level 11.6   #24 July 2012: Completed eighth cycle of treatment with modified FOLFOX 6+ Avastin chemotherapy  #9  Completed 12 cycles of modified FOLFOX/Avastin with day 3 Neulasta on 12/05/2012.  #10 Restaging scan on 03/01/13 revealed 1. Today's study demonstrates at least 2 discrete foci of hypermetabolism in the liver, suspicious for metastatic disease. In addition, there are numerous bilateral pulmonary nodules. These pulmonary nodules are all subcentimeter in size, with the largest nodules measuring 8 mm in the left upper lobe and 7 mm in the right upper lobe, none of which demonstrate definite hypermetabolism on the PET portion of the examination. Despite the lack of hypermetabolism, these findings are highly concerning for pulmonary metastases (likely below the resolution of PET imaging). Attention on followup studies is recommended.  Status post partial colectomy at the splenic flexure, without findings to suggest local recurrence of disease  CURRENT THERAPY:   cycle 2 day 8 avastin  with xeloda days 1 -14 on a 21 day cycle (2/20 - 3/5)  INTERVAL HISTORY: Christopher Fuentes is a 53 y.o. male who returns for followup of metastatic colon cancer.  He is currently taking Xeloda 2561m by mouth twice a day for his metastatic colorectal carcinoma.  He is tolerating it moderately well.  He does have some dryness on his palms, but no erythema or cracking.  He is taking Cipro BID that was prescribed at his last appointment.  He is tolerating it well.  He denies fevers, chills, nausea, vomiting, constipation, diarrhea, numbness, mucositis, pain, easy bruising/bleeding or any other concerns.    MEDICAL HISTORY: Past Medical History  Diagnosis Date  . Colon cancer 01/2012    stage IV   . 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 Hypertension 11/18/2012    ALLERGIES:   Allergies  Allergen Reactions  . Other  Hives and Swelling    Sea food  . Shellfish Allergy     MEDICATIONS:  Current Outpatient Prescriptions  Medication Sig Dispense Refill  . capecitabine (XELODA) 500 MG tablet Take 5 tablets (2,500 mg total) by mouth 2 (two) times daily after a meal. Take on days 1-14. Repeat every 21 days.  112 tablet  6  . ciprofloxacin (CIPRO) 500 MG tablet Take 1 tablet (500 mg total) by mouth 2 (two) times daily.  20 tablet  2  . gabapentin (NEURONTIN) 100 MG capsule Take 1 capsule (100 mg total) by mouth 3 (three) times daily.  90 capsule  2  . losartan-hydrochlorothiazide (HYZAAR) 100-25 MG per tablet Take 1 tablet by mouth daily.  30 tablet  6  . clindamycin (CLEOCIN-T) 1 % lotion Apply topically 2 (two) times daily.  60 mL  1   No current facility-administered medications for this visit.    SURGICAL HISTORY:  Past Surgical History  Procedure Laterality Date  . Colon resection  01/2012    REVIEW OF SYSTEMS:  A 10 point review of systems was completed and is negative except as noted above.      PHYSICAL EXAMINATION: Blood pressure 130/78, pulse 82, temperature 98 F (36.7 C), temperature source Oral, resp. rate 18, height 6' 1"  (1.854 m), weight 217 lb (98.431 kg). Body mass index is 28.64 kg/(m^2). General: Patient is a well appearing male in no acute distress HEENT: PERRLA, sclerae anicteric no conjunctival pallor, MMM Neck: supple, no palpable adenopathy Lungs: clear to auscultation bilaterally, no wheezes, rhonchi, or rales Cardiovascular: regular rate rhythm, S1, S2, no murmurs, rubs or gallops Abdomen: Soft, non-tender, non-distended, normoactive bowel sounds, no HSM Extremities: warm and well perfused, no clubbing, cyanosis, or edema Skin: No rashes or lesions Neuro: Non-focal ECOG PERFORMANCE STATUS: 1 - Symptomatic but completely ambulatory  LABORATORY DATA: Lab Results  Component Value Date   WBC 2.2* 04/14/2013   HGB 12.9* 04/14/2013   HCT 38.8 04/14/2013   MCV 83.9  04/14/2013   PLT 183 04/14/2013      Chemistry      Component Value Date/Time   NA 139 04/14/2013 0910   K 3.9 04/14/2013 0910   CO2 24 04/14/2013 0910   BUN 22.2 04/14/2013 0910   CREATININE 1.4* 04/14/2013 0910      Component Value Date/Time   CALCIUM 9.4 04/14/2013 0910   ALKPHOS 64 04/14/2013 0910   AST 32 04/14/2013 0910   ALT 17 04/14/2013 0910   BILITOT 0.98 04/14/2013 0910       RADIOGRAPHIC STUDIES: No results found.  ASSESSMENT/PLAN: Christopher Fuentes is a 53 y.o. gentleman with:  #1 Metastatic colon carcinoma with liver and lung metastasis. Patient received palliative chemotherapy with modified FOLFOX 6 and Avastin x 12 cycles from 03/2012 through 12/05/2012. A restaging PET scan has been ordered for Christopher Fuentes.   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 Refills were given today.  #3 maintenance chemotherapy with xeloda day 1-14 and avastin q 21 days.  He  received Avastin last week.  Today his [place] is 700.  He will continue taking Cipro, however after discussion with Dr. Jana Fuentes, he will stop taking Xeloda and return on 3/13 for cycle 3 of therapy.    #4 neutropenia: Secondary to chemotherapy ANC 700. He will continue taking Cipro BID.    #5 patient will be seen back on 04/28/2013 in followup as well as to proceed with cycle 3 of his treatment.  All questions answered. Christopher Fuentes was encouraged to contact us in the interim with any questions, concerns, or problems.   I spent 25 minutes counseling the patient face to face.  The total time spent in the appointment was 30 minutes.  Minette Headland, [place] [phone] 04/14/2013, 9:53 AM

## 2013-04-14 NOTE — Telephone Encounter (Signed)
Called pt to let him know that Mendel Ryder is running late due to daycare and wanted him to come in later this am to fit the schedule so he does not have to wait. Pt was very thankful for my call.  Confirmed 8:45 lab and 9am appts w/ him.  Went over to lab to make them aware of the switch.  Went to [employer] (NT) to make her aware and emailed all parties involved to make them aware.

## 2013-04-21 ENCOUNTER — Other Ambulatory Visit: Payer: Self-pay

## 2013-04-21 ENCOUNTER — Other Ambulatory Visit (HOSPITAL_BASED_OUTPATIENT_CLINIC_OR_DEPARTMENT_OTHER): Payer: 59

## 2013-04-21 DIAGNOSIS — C189 Malignant neoplasm of colon, unspecified: Secondary | ICD-10-CM

## 2013-04-21 DIAGNOSIS — C185 Malignant neoplasm of splenic flexure: Secondary | ICD-10-CM

## 2013-04-21 DIAGNOSIS — C787 Secondary malignant neoplasm of liver and intrahepatic bile duct: Secondary | ICD-10-CM

## 2013-04-21 DIAGNOSIS — D702 Other drug-induced agranulocytosis: Secondary | ICD-10-CM

## 2013-04-21 LAB — COMPREHENSIVE METABOLIC PANEL (CC13)
ALT: 17 U/L (ref 0–55)
AST: 32 U/L (ref 5–34)
Albumin: 4.1 g/dL (ref 3.5–5.0)
Alkaline Phosphatase: 73 U/L (ref 40–150)
Anion Gap: 10 mEq/L (ref 3–11)
BUN: 20.2 mg/dL (ref 7.0–26.0)
CALCIUM: 9.8 mg/dL (ref 8.4–10.4)
CHLORIDE: 105 meq/L (ref 98–109)
CO2: 26 meq/L (ref 22–29)
CREATININE: 1.6 mg/dL — AB (ref 0.7–1.3)
Glucose: 119 mg/dl (ref 70–140)
POTASSIUM: 4 meq/L (ref 3.5–5.1)
Sodium: 141 mEq/L (ref 136–145)
TOTAL PROTEIN: 8 g/dL (ref 6.4–8.3)
Total Bilirubin: 0.62 mg/dL (ref 0.20–1.20)

## 2013-04-21 LAB — CBC WITH DIFFERENTIAL/PLATELET
BASO%: 1.1 % (ref 0.0–2.0)
Basophils Absolute: 0 10*3/uL (ref 0.0–0.1)
EOS%: 3.8 % (ref 0.0–7.0)
Eosinophils Absolute: 0.1 10*3/uL (ref 0.0–0.5)
HEMATOCRIT: 38.8 % (ref 38.4–49.9)
HEMOGLOBIN: 12.9 g/dL — AB (ref 13.0–17.1)
LYMPH#: 1.6 10*3/uL (ref 0.9–3.3)
LYMPH%: 44.7 % (ref 14.0–49.0)
MCH: 28.1 pg (ref 27.2–33.4)
MCHC: 33.1 g/dL (ref 32.0–36.0)
MCV: 84.8 fL (ref 79.3–98.0)
MONO#: 0.8 10*3/uL (ref 0.1–0.9)
MONO%: 22 % — ABNORMAL HIGH (ref 0.0–14.0)
NEUT#: 1 10*3/uL — ABNORMAL LOW (ref 1.5–6.5)
NEUT%: 28.4 % — AB (ref 39.0–75.0)
Platelets: 235 10*3/uL (ref 140–400)
RBC: 4.57 10*6/uL (ref 4.20–5.82)
RDW: 13.9 % (ref 11.0–14.6)
WBC: 3.6 10*3/uL — ABNORMAL LOW (ref 4.0–10.3)

## 2013-04-21 LAB — UA PROTEIN, DIPSTICK - CHCC: Protein, ur: <30 mg/dL

## 2013-04-24 ENCOUNTER — Other Ambulatory Visit: Payer: Self-pay | Admitting: *Deleted

## 2013-04-24 ENCOUNTER — Telehealth: Payer: Self-pay | Admitting: *Deleted

## 2013-04-24 MED ORDER — CIPROFLOXACIN HCL 500 MG PO TABS: 500.0000 mg | ORAL_TABLET | Freq: Two times a day (BID) | ORAL | Status: DC

## 2013-04-24 NOTE — Telephone Encounter (Signed)
Left patient a message per Dr Humphrey Rolls- "do not start xeloda this week, Total white count good, but ANC low (improved from last week however), Start cipro 500 mg twice a day for 7 days".

## 2013-04-28 ENCOUNTER — Telehealth: Payer: Self-pay | Admitting: *Deleted

## 2013-04-28 ENCOUNTER — Ambulatory Visit (HOSPITAL_BASED_OUTPATIENT_CLINIC_OR_DEPARTMENT_OTHER): Payer: 59

## 2013-04-28 ENCOUNTER — Other Ambulatory Visit (HOSPITAL_BASED_OUTPATIENT_CLINIC_OR_DEPARTMENT_OTHER): Payer: 59

## 2013-04-28 ENCOUNTER — Encounter: Payer: Self-pay | Admitting: Oncology

## 2013-04-28 ENCOUNTER — Ambulatory Visit (HOSPITAL_BASED_OUTPATIENT_CLINIC_OR_DEPARTMENT_OTHER): Payer: 59 | Admitting: Oncology

## 2013-04-28 VITALS — BP 133/83 | HR 71 | Temp 97.7°F | Resp 20 | Ht 71.0 in | Wt 215.4 lb

## 2013-04-28 VITALS — BP 122/82

## 2013-04-28 DIAGNOSIS — C185 Malignant neoplasm of splenic flexure: Secondary | ICD-10-CM

## 2013-04-28 DIAGNOSIS — D702 Other drug-induced agranulocytosis: Secondary | ICD-10-CM

## 2013-04-28 DIAGNOSIS — C189 Malignant neoplasm of colon, unspecified: Secondary | ICD-10-CM

## 2013-04-28 DIAGNOSIS — C787 Secondary malignant neoplasm of liver and intrahepatic bile duct: Secondary | ICD-10-CM

## 2013-04-28 DIAGNOSIS — C7800 Secondary malignant neoplasm of unspecified lung: Secondary | ICD-10-CM

## 2013-04-28 DIAGNOSIS — Z5112 Encounter for antineoplastic immunotherapy: Secondary | ICD-10-CM

## 2013-04-28 LAB — CBC WITH DIFFERENTIAL/PLATELET
BASO%: 1.5 % (ref 0.0–2.0)
BASOS ABS: 0 10*3/uL (ref 0.0–0.1)
EOS ABS: 0.1 10*3/uL (ref 0.0–0.5)
EOS%: 4.6 % (ref 0.0–7.0)
HCT: 39.2 % (ref 38.4–49.9)
HEMOGLOBIN: 13 g/dL (ref 13.0–17.1)
LYMPH%: 40.4 % (ref 14.0–49.0)
MCH: 28.3 pg (ref 27.2–33.4)
MCHC: 33.1 g/dL (ref 32.0–36.0)
MCV: 85.3 fL (ref 79.3–98.0)
MONO#: 0.7 10*3/uL (ref 0.1–0.9)
MONO%: 22.8 % — ABNORMAL HIGH (ref 0.0–14.0)
NEUT%: 30.7 % — ABNORMAL LOW (ref 39.0–75.0)
NEUTROS ABS: 0.9 10*3/uL — AB (ref 1.5–6.5)
PLATELETS: 161 10*3/uL (ref 140–400)
RBC: 4.59 10*6/uL (ref 4.20–5.82)
RDW: 15 % — ABNORMAL HIGH (ref 11.0–14.6)
WBC: 2.9 10*3/uL — ABNORMAL LOW (ref 4.0–10.3)
lymph#: 1.2 10*3/uL (ref 0.9–3.3)

## 2013-04-28 LAB — COMPREHENSIVE METABOLIC PANEL (CC13)
ALBUMIN: 3.7 g/dL (ref 3.5–5.0)
ALK PHOS: 89 U/L (ref 40–150)
ALT: 18 U/L (ref 0–55)
AST: 26 U/L (ref 5–34)
Anion Gap: 10 mEq/L (ref 3–11)
BILIRUBIN TOTAL: 0.53 mg/dL (ref 0.20–1.20)
BUN: 13.5 mg/dL (ref 7.0–26.0)
CO2: 24 mEq/L (ref 22–29)
Calcium: 9.5 mg/dL (ref 8.4–10.4)
Chloride: 107 mEq/L (ref 98–109)
Creatinine: 1.4 mg/dL — ABNORMAL HIGH (ref 0.7–1.3)
GLUCOSE: 112 mg/dL (ref 70–140)
Potassium: 3.6 mEq/L (ref 3.5–5.1)
Sodium: 141 mEq/L (ref 136–145)
Total Protein: 7.5 g/dL (ref 6.4–8.3)

## 2013-04-28 LAB — UA PROTEIN, DIPSTICK - CHCC: Protein, ur: NEGATIVE mg/dL

## 2013-04-28 MED ORDER — SODIUM CHLORIDE 0.9 % IV SOLN
Freq: Once | INTRAVENOUS | Status: AC
  Administered 2013-04-28: 10:00:00 via INTRAVENOUS

## 2013-04-28 MED ORDER — SODIUM CHLORIDE 0.9 % IJ SOLN
10.0000 mL | INTRAMUSCULAR | Status: DC | PRN
  Administered 2013-04-28: 10 mL
  Filled 2013-04-28: qty 10

## 2013-04-28 MED ORDER — HEPARIN SOD (PORK) LOCK FLUSH 100 UNIT/ML IV SOLN
500.0000 [IU] | Freq: Once | INTRAVENOUS | Status: AC | PRN
  Administered 2013-04-28: 500 [IU]
  Filled 2013-04-28: qty 5

## 2013-04-28 MED ORDER — SODIUM CHLORIDE 0.9 % IV SOLN
15.0000 mg/kg | Freq: Once | INTRAVENOUS | Status: AC
  Administered 2013-04-28: 1475 mg via INTRAVENOUS
  Filled 2013-04-28: qty 59

## 2013-04-28 NOTE — Patient Instructions (Signed)
[facility] Discharge Instructions for Patients Receiving Chemotherapy  Today you received the following chemotherapy agent: Avastin   To help prevent nausea and vomiting after your treatment, we encourage you to take your nausea medication as prescribed.    If you develop nausea and vomiting that is not controlled by your nausea medication, call the clinic.   BELOW ARE SYMPTOMS THAT SHOULD BE REPORTED IMMEDIATELY:  *FEVER GREATER THAN 100.5 F  *CHILLS WITH OR WITHOUT FEVER  NAUSEA AND VOMITING THAT IS NOT CONTROLLED WITH YOUR NAUSEA MEDICATION  *UNUSUAL SHORTNESS OF BREATH  *UNUSUAL BRUISING OR BLEEDING  TENDERNESS IN MOUTH AND THROAT WITH OR WITHOUT PRESENCE OF ULCERS  *URINARY PROBLEMS  *BOWEL PROBLEMS  UNUSUAL RASH Items with * indicate a potential emergency and should be followed up as soon as possible.  Feel free to call the clinic you have any questions or concerns. The clinic phone number is [phone].    

## 2013-04-28 NOTE — Telephone Encounter (Signed)
appts made and printed. Pt is aware that tx will be added. i emailed MW to add the tx. i printed the order an gv to MD to enter [place] REQUESTED.Marland KitchenMarland KitchenTD

## 2013-04-28 NOTE — Progress Notes (Signed)
[place]  Telephone:(336) [phone] Fax:(336) [phone]  OFFICE PROGRESS NOTE   ID: [name]   DOB: November 14, 1960  MR#: [id number]  AJO#:[id number]   PCP: No PCP [place]:  Dr. Joline Salt   DIAGNOSIS: Christopher Fuentes is a 53 y.o. gentleman with metastatic colon carcinoma, originally was diagnosed in [facility].   PRIOR THERAPY: #1 In December 2013 the patient developed weeks of mild abdominal pain and decreased appetite with nausea and vomiting.  A CT in the ER revealed inflammatory colon mass at the splenic flexure with obvious metastasis to the liver.  In 01/2012 he underwent surgery for a large bowel obstruction which consisted of a partial colectomy with site to site functional and end to end staple anastomosis. Pathology revealed 6.0 cm tumor, invasive adenocarcinoma, low grade,  no lymphovascular invasion or perineural invasion. 0 of 15 lymph nodes were positive,  margins were negative. Pathologic stage T3 N0.   DNA MMR staining by immunohistochemistry was intact K.RAS mutation not found at exon 1 & 2 (including codone 12, 13, 14, 61).  #18 February 2012 CEA level was elevated at 120.    #3 In February 2014: CT chest, abdomen/pelvis revealed multiple bilateral lung masses,  at least 5 lesions in each lung, 2 cm lesion noted in segment of liver, multiple small mesenteric lymph nodes which were stable and not pathologically enlarged no bony lesions.   #4 In February 2014 a right liver biopsy of segment 5 showed metastatic adenocarcinoma consistent with colon origin, positive for CK 20 CDX2 and villin negative for PSA TTF-1 and CK 7.   #5 In  February 2014 adjuvant chemotherapy consisting of modified FOLFOX 6+ Avastin chemotherapy was started.  #6 In April 2014 CEA level 35 after third cycle of treatment.   CT chest, abdomen/pelvis: Stable bilateral subcentimeter pulmonary nodules mild decrease in left lower lobe nodule decrease in size of  lesion within the right hepatic lobe no new lesions seen   #22 Jun 2012 CEA level 11.6   #24 July 2012: Completed eighth cycle of treatment with modified FOLFOX 6+ Avastin chemotherapy  #9  Completed 12 cycles of modified FOLFOX/Avastin with day 3 Neulasta on 12/05/2012.  #10 Restaging scan on 03/01/13 revealed 1. Today's study demonstrates at least 2 discrete foci of hypermetabolism in the liver, suspicious for metastatic disease. In addition, there are numerous bilateral pulmonary nodules. These pulmonary nodules are all subcentimeter in size, with the largest nodules measuring 8 mm in the left upper lobe and 7 mm in the right upper lobe, none of which demonstrate definite hypermetabolism on the PET portion of the examination. Despite the lack of hypermetabolism, these findings are highly concerning for pulmonary metastases (likely below the resolution of PET imaging). Attention on followup studies is recommended.  Status post partial colectomy at the splenic flexure, without findings to suggest local recurrence of disease  CURRENT THERAPY:   cycle 3 day 1 avastin  Only (holding xeloda due to neutropenia)  INTERVAL HISTORY: Christopher Fuentes is a 53 y.o. male who returns for followup of metastatic colon cancer. Clinically patient doing well. Thus far has been tolerating his treatment well. Unfortunately has a low.have to be held the last week to 2 the fact that he was neutropenic. Today he feels well unfortunately he still is neutropenic. I have recommended that he get Neulasta injection on Saturday to try to get his counts back up in the meantime we will continue to hold  the Xeloda for now at least with this cycle. He is otherwise having no fevers chills night sweats headaches he has no shortness of breath chest pains palpitations no abdominal pain no peripheral paresthesias no bleeding problems he has not noticed any hematuria hematochezia melena hemoptysis or hematemesis. Remainder of the 10 point  review of systems is negative.  MEDICAL HISTORY: Past Medical History  Diagnosis Date  . Colon cancer 01/2012    stage IV   . 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 Hypertension 11/18/2012    ALLERGIES:   Allergies  Allergen Reactions  . Other Hives and Swelling    Sea food  . Shellfish Allergy     MEDICATIONS:  Current Outpatient Prescriptions  Medication Sig Dispense Refill  . capecitabine (XELODA) 500 MG tablet Take 5 tablets (2,500 mg total) by mouth 2 (two) times daily after a meal. Take on days 1-14. Repeat every 21 days.  112 tablet  6  . ciprofloxacin (CIPRO) 500 MG tablet Take 1 tablet (500 mg total) by mouth 2 (two) times daily. For 7 days  14 tablet  0  . clindamycin (CLEOCIN-T) 1 % lotion Apply topically 2 (two) times daily.  60 mL  1  . gabapentin (NEURONTIN) 100 MG capsule Take 1 capsule (100 mg total) by mouth 3 (three) times daily.  90 capsule  2  . losartan-hydrochlorothiazide (HYZAAR) 100-25 MG per tablet Take 1 tablet by mouth daily.  30 tablet  6   No current facility-administered medications for this visit.    SURGICAL HISTORY:  Past Surgical History  Procedure Laterality Date  . Colon resection  01/2012    REVIEW OF SYSTEMS:  A 10 point review of systems was completed and is negative except as noted above.      PHYSICAL EXAMINATION: Blood pressure 133/83, pulse 71, temperature 97.7 F (36.5 C), temperature source Oral, resp. rate 20, height 5' 11"  (1.803 m), weight 215 lb 6.4 oz (97.705 kg). Body mass index is 30.06 kg/(m^2). General: Patient is a well appearing male in no acute distress HEENT: PERRLA, sclerae anicteric no conjunctival pallor, MMM Neck: supple, no palpable adenopathy Lungs: clear to auscultation bilaterally, no wheezes, rhonchi, or rales Cardiovascular: regular rate rhythm, S1, S2, no murmurs, rubs or gallops Abdomen: Soft, non-tender, non-distended, normoactive bowel sounds, no HSM Extremities: warm and well  perfused, no clubbing, cyanosis, or edema Skin: No rashes or lesions Neuro: Non-focal ECOG PERFORMANCE STATUS: 1 - Symptomatic but completely ambulatory  LABORATORY DATA: Lab Results  Component Value Date   WBC 2.9* 04/28/2013   HGB 13.0 04/28/2013   HCT 39.2 04/28/2013   MCV 85.3 04/28/2013   PLT 161 04/28/2013      Chemistry      Component Value Date/Time   NA 141 04/21/2013 0912   K 4.0 04/21/2013 0912   CO2 26 04/21/2013 0912   BUN 20.2 04/21/2013 0912   CREATININE 1.6* 04/21/2013 0912      Component Value Date/Time   CALCIUM 9.8 04/21/2013 0912   ALKPHOS 73 04/21/2013 0912   AST 32 04/21/2013 0912   ALT 17 04/21/2013 0912   BILITOT 0.62 04/21/2013 0912       RADIOGRAPHIC STUDIES: No results found.  ASSESSMENT/PLAN: Armari Fussell is a 53 y.o. gentleman with:  #1 Metastatic colon carcinoma with liver and lung metastasis. Patient received palliative chemotherapy with modified FOLFOX 6 and Avastin x 12 cycles from 03/2012 through 12/05/2012. A restaging PET scan has been ordered for Mr. Ginyard.   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  We will give neulasta on 3/14, He will continue taking Cipro BID.    #5 patient will be seen back on 05/05/2013 in followup   All questions answered. Mr. Fell was encouraged to contact us in the interim with any questions, concerns, or problems.   I spent 25 minutes counseling the patient face to face.  The total time spent in the appointment was 30 minutes.  Marcy Panning, MD Medical/Oncology [facility] [phone] (beeper) [phone] (Office)  04/28/2013, 9:13 AM

## 2013-04-28 NOTE — Patient Instructions (Signed)
No xeloda (oral chemotherapy for the next 2 weeks)  We will give you avastin today (infusion)  Return on 3/14 for shot (neulasta to boost your white cells)  You will be seen back by [place] on 4/3, 4/10  I will see you back on 3/20 at 8:00 am

## 2013-04-28 NOTE — Progress Notes (Signed)
Per Dr. Humphrey Rolls via Meredeth Ide, RN, patient to proceed with avastin, he will return on the next day for Neulasta. MD aware of low WBC and ANC. Pharmacy notified.

## 2013-04-29 ENCOUNTER — Ambulatory Visit (HOSPITAL_BASED_OUTPATIENT_CLINIC_OR_DEPARTMENT_OTHER): Payer: 59

## 2013-04-29 VITALS — BP 143/92 | HR 62 | Temp 97.0°F

## 2013-04-29 DIAGNOSIS — C189 Malignant neoplasm of colon, unspecified: Secondary | ICD-10-CM

## 2013-04-29 DIAGNOSIS — D702 Other drug-induced agranulocytosis: Secondary | ICD-10-CM

## 2013-04-29 MED ORDER — PEGFILGRASTIM INJECTION 6 MG/0.6ML
6.0000 mg | Freq: Once | SUBCUTANEOUS | Status: AC
  Administered 2013-04-29: 6 mg via SUBCUTANEOUS

## 2013-04-29 NOTE — Patient Instructions (Signed)

## 2013-05-01 ENCOUNTER — Telehealth: Payer: Self-pay | Admitting: *Deleted

## 2013-05-01 NOTE — Telephone Encounter (Signed)
Lm informed the pt that on 05/19/13 his his changes are now w/labs@ 1:45pm, ov@2 :15pm and tx to follow. i emailed MW to adjusted the tx and the pt is aware. Also pt is aware that i will mail a letter/avs....td

## 2013-05-02 ENCOUNTER — Telehealth: Payer: Self-pay | Admitting: *Deleted

## 2013-05-02 NOTE — Telephone Encounter (Signed)
Per staff message I have adjusted appts  

## 2013-05-05 ENCOUNTER — Ambulatory Visit (HOSPITAL_BASED_OUTPATIENT_CLINIC_OR_DEPARTMENT_OTHER): Payer: 59 | Admitting: Oncology

## 2013-05-05 ENCOUNTER — Encounter: Payer: Self-pay | Admitting: Oncology

## 2013-05-05 ENCOUNTER — Other Ambulatory Visit (HOSPITAL_BASED_OUTPATIENT_CLINIC_OR_DEPARTMENT_OTHER): Payer: 59

## 2013-05-05 VITALS — BP 136/85 | HR 87 | Temp 98.4°F | Resp 18 | Ht 71.0 in | Wt 217.9 lb

## 2013-05-05 DIAGNOSIS — C787 Secondary malignant neoplasm of liver and intrahepatic bile duct: Secondary | ICD-10-CM

## 2013-05-05 DIAGNOSIS — C189 Malignant neoplasm of colon, unspecified: Secondary | ICD-10-CM

## 2013-05-05 DIAGNOSIS — C185 Malignant neoplasm of splenic flexure: Secondary | ICD-10-CM

## 2013-05-05 DIAGNOSIS — Z5111 Encounter for antineoplastic chemotherapy: Secondary | ICD-10-CM

## 2013-05-05 DIAGNOSIS — T451X5A Adverse effect of antineoplastic and immunosuppressive drugs, initial encounter: Secondary | ICD-10-CM

## 2013-05-05 DIAGNOSIS — D6959 Other secondary thrombocytopenia: Secondary | ICD-10-CM

## 2013-05-05 LAB — CBC WITH DIFFERENTIAL/PLATELET
BASO%: 0.7 % (ref 0.0–2.0)
Basophils Absolute: 0.1 10*3/uL (ref 0.0–0.1)
EOS%: 0.9 % (ref 0.0–7.0)
Eosinophils Absolute: 0.1 10*3/uL (ref 0.0–0.5)
HEMATOCRIT: 40 % (ref 38.4–49.9)
HGB: 12.8 g/dL — ABNORMAL LOW (ref 13.0–17.1)
LYMPH%: 17.2 % (ref 14.0–49.0)
MCH: 27.6 pg (ref 27.2–33.4)
MCHC: 32 g/dL (ref 32.0–36.0)
MCV: 86.1 fL (ref 79.3–98.0)
MONO#: 2 10*3/uL — AB (ref 0.1–0.9)
MONO%: 14.7 % — ABNORMAL HIGH (ref 0.0–14.0)
NEUT#: 8.8 10*3/uL — ABNORMAL HIGH (ref 1.5–6.5)
NEUT%: 66.5 % (ref 39.0–75.0)
Platelets: 109 10*3/uL — ABNORMAL LOW (ref 140–400)
RBC: 4.64 10*6/uL (ref 4.20–5.82)
RDW: 17.4 % — ABNORMAL HIGH (ref 11.0–14.6)
WBC: 13.3 10*3/uL — ABNORMAL HIGH (ref 4.0–10.3)
lymph#: 2.3 10*3/uL (ref 0.9–3.3)

## 2013-05-05 LAB — COMPREHENSIVE METABOLIC PANEL (CC13)
ALT: 33 U/L (ref 0–55)
ANION GAP: 12 meq/L — AB (ref 3–11)
AST: 50 U/L — ABNORMAL HIGH (ref 5–34)
Albumin: 3.6 g/dL (ref 3.5–5.0)
Alkaline Phosphatase: 187 U/L — ABNORMAL HIGH (ref 40–150)
BUN: 14 mg/dL (ref 7.0–26.0)
CO2: 21 meq/L — AB (ref 22–29)
Calcium: 9.3 mg/dL (ref 8.4–10.4)
Chloride: 105 mEq/L (ref 98–109)
Creatinine: 1.5 mg/dL — ABNORMAL HIGH (ref 0.7–1.3)
Glucose: 147 mg/dl — ABNORMAL HIGH (ref 70–140)
Potassium: 3.7 mEq/L (ref 3.5–5.1)
Sodium: 139 mEq/L (ref 136–145)
Total Bilirubin: 0.51 mg/dL (ref 0.20–1.20)
Total Protein: 7.5 g/dL (ref 6.4–8.3)

## 2013-05-05 LAB — UA PROTEIN, DIPSTICK - CHCC: Protein, ur: NEGATIVE mg/dL

## 2013-05-05 NOTE — Progress Notes (Signed)
[place] OFFICE PROGRESS NOTE  Patient Care Team: No Pcp Per Patient as PCP - General (General Practice)  DIAGNOSIS:   SUMMARY OF ONCOLOGIC HISTORY: #1 In December 2013 the patient developed weeks of mild abdominal pain and decreased appetite with nausea and vomiting. A CT in the ER revealed inflammatory colon mass at the splenic flexure with obvious metastasis to the liver. In 01/2012 he underwent surgery for a large bowel obstruction which consisted of a partial colectomy with site to site functional and end to end staple anastomosis. Pathology revealed 6.0 cm tumor, invasive adenocarcinoma, low grade, no lymphovascular invasion or perineural invasion. 0 of 15 lymph nodes were positive, margins were negative. Pathologic stage T3 N0. DNA MMR staining by immunohistochemistry was intact K.RAS mutation not found at exon 1 & 2 (including codone 12, 13, 14, 61).  #18 February 2012 CEA level was elevated at 120.  #3 In February 2014: CT chest, abdomen/pelvis revealed multiple bilateral lung masses, at least 5 lesions in each lung, 2 cm lesion noted in segment of liver, multiple small mesenteric lymph nodes which were stable and not pathologically enlarged no bony lesions.  #4 In February 2014 a right liver biopsy of segment 5 showed metastatic adenocarcinoma consistent with colon origin, positive for CK 20 CDX2 and villin negative for PSA TTF-1 and CK 7.  #5 In February 2014 adjuvant chemotherapy consisting of modified FOLFOX 6+ Avastin chemotherapy was started.  #6 In April 2014 CEA level 35 after third cycle of treatment. CT chest, abdomen/pelvis: Stable bilateral subcentimeter pulmonary nodules mild decrease in left lower lobe nodule decrease in size of lesion within the right hepatic lobe no new lesions seen  #22 Jun 2012 CEA level 11.6  #24 July 2012: Completed eighth cycle of treatment with modified FOLFOX 6+ Avastin chemotherapy  #9 Completed 12 cycles of modified FOLFOX/Avastin with  day 3 Neulasta on 12/05/2012.  #10 Restaging scan on 03/01/13 revealed 1. Today's study demonstrates at least 2 discrete foci of hypermetabolism in the liver, suspicious for metastatic disease. In addition, there are numerous bilateral pulmonary nodules. These pulmonary nodules are all subcentimeter in size, with the largest nodules measuring 8 mm in the left upper lobe and 7 mm in the right upper lobe, none of which demonstrate definite hypermetabolism on the PET portion of the examination. Despite the lack of hypermetabolism, these findings are highly concerning for pulmonary metastases (likely below the resolution of PET imaging). Attention on followup studies is recommended. Status post partial colectomy at the splenic flexure, without findings to suggest local recurrence of disease    INTERVAL HISTORY: Christopher Fuentes 53 y.o. male returns for followup visit for his metastatic colon carcinoma. He received cycle 3 day 1 of chemotherapy on 04/28/2013. He seems to be tolerating it quite well. We did have to hold his a low.due to neutropenia. On 314 I did go ahead and give the patient Neulasta. His white count has recovered very nicely today. He tells me he is doing well. He has minimal side effects from his treatment so far. He does notice some peeling of the skin of his hands. I recommended that he use Aquaphor for this. He also has what he calls the sniffles. He's been doing symptomatic management. I discussed with him doing saline nose drops gargles and use Benadryl as needed. Today she denies any headaches double vision blurring of vision fevers chills night sweats. No shortness of breath chest pains palpitations. No abdominal pain no diarrhea or constipation. She  has no easy bruising or bleeding. She has no myalgias and arthralgias. No peripheral paresthesias or gait disturbances. Remainder of the 10 point review of systems is negative.   I have reviewed the past medical history, past surgical history,  social history and family history with the patient and they are unchanged from previous note.  ALLERGIES:  is allergic to other and shellfish allergy.  MEDICATIONS:  Current Outpatient Prescriptions  Medication Sig Dispense Refill  . capecitabine (XELODA) 500 MG tablet Take 5 tablets (2,500 mg total) by mouth 2 (two) times daily after a meal. Take on days 1-14. Repeat every 21 days.  112 tablet  6  . ciprofloxacin (CIPRO) 500 MG tablet Take 1 tablet (500 mg total) by mouth 2 (two) times daily. For 7 days  14 tablet  0  . clindamycin (CLEOCIN-T) 1 % lotion Apply topically 2 (two) times daily.  60 mL  1  . gabapentin (NEURONTIN) 100 MG capsule Take 1 capsule (100 mg total) by mouth 3 (three) times daily.  90 capsule  2  . losartan-hydrochlorothiazide (HYZAAR) 100-25 MG per tablet Take 1 tablet by mouth daily.  30 tablet  6   No current facility-administered medications for this visit.    REVIEW OF SYSTEMS:   Constitutional: Denies fevers, chills or abnormal weight loss Eyes: Denies blurriness of vision Ears, nose, mouth, throat, and face: Denies mucositis or sore throat Respiratory: Denies cough, dyspnea or wheezes Cardiovascular: Denies palpitation, chest discomfort or lower extremity swelling Gastrointestinal:  Denies nausea, heartburn or change in bowel habits Skin: Denies abnormal skin rashes Lymphatics: Denies new lymphadenopathy or easy bruising Neurological:Denies numbness, tingling or new weaknesses Behavioral/Psych: Mood is stable, no new changes  All other systems were reviewed with the patient and are negative.  PHYSICAL EXAMINATION: ECOG PERFORMANCE STATUS: 1 - Symptomatic but completely ambulatory  Filed Vitals:   05/05/13 0815  BP: 136/85  Pulse: 87  Temp: 98.4 F (36.9 C)  Resp: 18   Filed Weights   05/05/13 0815  Weight: 217 lb 14.4 oz (98.839 kg)    GENERAL:alert, no distress and comfortable SKIN: skin color, texture, turgor are normal, no rashes or  significant lesions EYES: normal, Conjunctiva are pink and non-injected, sclera clear OROPHARYNX:no exudate, no erythema and lips, buccal mucosa, and tongue normal  NECK: supple, thyroid normal size, non-tender, without nodularity LYMPH:  no palpable lymphadenopathy in the cervical, axillary or inguinal LUNGS: clear to auscultation and percussion with normal breathing effort HEART: regular rate & rhythm and no murmurs and no lower extremity edema ABDOMEN:abdomen soft, non-tender and normal bowel sounds Musculoskeletal:no cyanosis of digits and no clubbing  NEURO: alert & oriented x 3 with fluent speech, no focal motor/sensory deficits  LABORATORY DATA:  I have reviewed the data as listed    Component Value Date/Time   NA 139 05/05/2013 0803   K 3.7 05/05/2013 0803   CO2 21* 05/05/2013 0803   GLUCOSE 147* 05/05/2013 0803   BUN 14.0 05/05/2013 0803   CREATININE 1.5* 05/05/2013 0803   CALCIUM 9.3 05/05/2013 0803   PROT 7.5 05/05/2013 0803   ALBUMIN 3.6 05/05/2013 0803   AST 50* 05/05/2013 0803   ALT 33 05/05/2013 0803   ALKPHOS 187* 05/05/2013 0803   BILITOT 0.51 05/05/2013 0803    No results found for this basename: SPEP, UPEP,  kappa and lambda light chains    Lab Results  Component Value Date   WBC 13.3* 05/05/2013   NEUTROABS 8.8* 05/05/2013   HGB 12.8*  05/05/2013   HCT 40.0 05/05/2013   MCV 86.1 05/05/2013   PLT 109* 05/05/2013      Chemistry      Component Value Date/Time   NA 139 05/05/2013 0803   K 3.7 05/05/2013 0803   CO2 21* 05/05/2013 0803   BUN 14.0 05/05/2013 0803   CREATININE 1.5* 05/05/2013 0803      Component Value Date/Time   CALCIUM 9.3 05/05/2013 0803   ALKPHOS 187* 05/05/2013 0803   AST 50* 05/05/2013 0803   ALT 33 05/05/2013 0803   BILITOT 0.51 05/05/2013 0803       RADIOGRAPHIC STUDIES: I have personally reviewed the radiological images as listed and agreed with the findings in the report. No results found.    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 He is due to get cycle 4 of Avastin on 05/19/2013. At that time my recommendation would be to begin Xeloda days 1 through 14.  #2 thrombocytopenia: Secondary to recent treatments. He is asymptomatic we will continue to follow him.  #3 elevated LFTs: He is asymptomatic we will continue to check his liver functions studies with each treatment and followup visit.  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  #5 dryness of the hands and skin: We discussed using Aquaphor.  #6 patient will be seen back on 05/19/2013 in followup for cycle 4 of his treatment.   All questions were answered. The patient knows to call the clinic with any problems, questions or concerns. No barriers to learning was detected. I spent 15 minutes counseling the patient face to face. The total time spent in the appointment was 30 minutes and more than 50% was on counseling and review of test results     Marcy Panning, MD 05/05/2013 12:31 PM

## 2013-05-05 NOTE — Patient Instructions (Signed)
Doing well today, you white count is much better, platelets are a little low and we will monitor  For your URI; use saline nose drops, gargle,   Skin peeling: use AQUAPHOR  We will see you back in 05/19/13 for avastin and neulasta day after.  Please call with any problems

## 2013-05-18 NOTE — Progress Notes (Signed)
Hematology and Oncology Follow Up Visit  Christopher Fuentes [id number] July 28, 1960 53 y.o. 05/19/2013 4:25 PM     Principle Diagnosis:Christopher Fuentes 53 y.o. male with stage IV colon cancer   Prior Therapy: #1 In December 2013 the patient developed weeks of mild abdominal pain and decreased appetite with nausea and vomiting. A CT in the ER revealed inflammatory colon mass at the splenic flexure with obvious metastasis to the liver. In 01/2012 he underwent surgery for a large bowel obstruction which consisted of a partial colectomy with site to site functional and end to end staple anastomosis. Pathology revealed 6.0 cm tumor, invasive adenocarcinoma, low grade, no lymphovascular invasion or perineural invasion. 0 of 15 lymph nodes were positive, margins were negative. Pathologic stage T3 N0. DNA MMR staining by immunohistochemistry was intact K.RAS mutation not found at exon 1 & 2 (including codone 12, 13, 14, 61).   #18 February 2012 CEA level was elevated at 120.   #3 In February 2014: CT chest, abdomen/pelvis revealed multiple bilateral lung masses, at least 5 lesions in each lung, 2 cm lesion noted in segment of liver, multiple small mesenteric lymph nodes which were stable and not pathologically enlarged no bony lesions.   #4 In February 2014 a right liver biopsy of segment 5 showed metastatic adenocarcinoma consistent with colon origin, positive for CK 20 CDX2 and villin negative for PSA TTF-1 and CK 7.   #5 In February 2014 adjuvant chemotherapy consisting of modified FOLFOX 6+ Avastin chemotherapy was started.   #6 In April 2014 CEA level 35 after third cycle of treatment. CT chest, abdomen/pelvis: Stable bilateral subcentimeter pulmonary nodules mild decrease in left lower lobe nodule decrease in size of lesion within the right hepatic lobe no new lesions seen   #22 Jun 2012 CEA level 11.6   #24 July 2012: Completed eighth cycle of treatment with modified FOLFOX 6+ Avastin chemotherapy   #9  Completed 12 cycles of modified FOLFOX/Avastin with day 3 Neulasta on 12/05/2012.   #10 Restaging scan on 03/01/13 revealed 1. Today's study demonstrates at least 2 discrete foci of hypermetabolism in the liver, suspicious for metastatic disease. In addition, there are numerous bilateral pulmonary nodules. These pulmonary nodules are all subcentimeter in size, with the largest nodules measuring 8 mm in the left upper lobe and 7 mm in the right upper lobe, none of which demonstrate definite hypermetabolism on the PET portion of the examination. Despite the lack of hypermetabolism, these findings are highly concerning for pulmonary metastases (likely below the resolution of PET imaging). Attention on followup studies is recommended. Status post partial colectomy at the splenic flexure, without findings to suggest local recurrence of disease    Current therapy: Avastin every 21 days, Xeloda days 1-14, currently day 1  Interim History: Christopher Fuentes 53 y.o. male with stage IV colon cancer here for f/u prior to receiving Avastin therapy.  He is doing well today.  He denies fevers, chills, nausea, vomiting, constipation, diarrhea, numbness, skin changes, easy bruising, easy bleeding, swelling, urinary changes, or any further concerns.    Medications:  Current Outpatient Prescriptions  Medication Sig Dispense Refill  . capecitabine (XELODA) 500 MG tablet Take 5 tablets (2,500 mg total) by mouth 2 (two) times daily after a meal. Take on days 1-14. Repeat every 21 days.  112 tablet  6  . gabapentin (NEURONTIN) 100 MG capsule Take 1 capsule (100 mg total) by mouth 3 (three) times daily.  90 capsule  2  . losartan-hydrochlorothiazide (HYZAAR) 100-25 MG  per tablet Take 1 tablet by mouth daily.  30 tablet  6   No current facility-administered medications for this visit.   Facility-Administered Medications Ordered in Other Visits  Medication Dose Route Frequency Provider Last Rate Last Dose  . sodium chloride  0.9 % injection 10 mL  10 mL Intracatheter PRN Deatra Robinson, MD   10 mL at 05/19/13 1605     Allergies:  Allergies  Allergen Reactions  . Other Hives and Swelling    Sea food  . Shellfish Allergy     Medical History: Past Medical History  Diagnosis Date  . Colon cancer 01/2012    stage IV   . 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 Hypertension 11/18/2012    Surgical History:  Past Surgical History  Procedure Laterality Date  . Colon resection  01/2012     Review of Systems: A 10 point review of systems was conducted and is otherwise negative except for what is noted above.     Physical Exam: Blood pressure 119/76, pulse 87, temperature 98.1 F (36.7 C), temperature source Oral, resp. rate 18, height 5' 11"  (1.803 m), weight 222 lb 8 oz (100.925 kg). GENERAL: Patient is a well appearing male in no acute distress HEENT:  Sclerae anicteric.  Oropharynx clear and moist. No ulcerations or evidence of oropharyngeal candidiasis. Neck is supple.  NODES:  No cervical, supraclavicular, or axillary lymphadenopathy palpated.  LUNGS:  Clear to auscultation bilaterally.  No wheezes or rhonchi. HEART:  Regular rate and rhythm. No murmur appreciated. ABDOMEN:  Soft, nontender.  Positive, normoactive bowel sounds. No organomegaly palpated. EXTREMITIES:  No peripheral edema.   SKIN:  Clear with no obvious rashes or skin changes. No nail dyscrasia. NEURO:  Nonfocal. Well oriented.  Appropriate affect. ECOG PERFORMANCE STATUS: 0 - Asymptomatic   Lab Results: Lab Results  Component Value Date   WBC 3.6* 05/19/2013   HGB 12.8* 05/19/2013   HCT 40.1 05/19/2013   MCV 86.6 05/19/2013   PLT 236 05/19/2013     Chemistry      Component Value Date/Time   NA 143 05/19/2013 1402   K 3.7 05/19/2013 1402   CO2 22 05/19/2013 1402   BUN 19.4 05/19/2013 1402   CREATININE 1.4* 05/19/2013 1402      Component Value Date/Time   CALCIUM 9.7 05/19/2013 1402   ALKPHOS 79 05/19/2013 1402   AST 43*  05/19/2013 1402   ALT 33 05/19/2013 1402   BILITOT 1.15 05/19/2013 1402       Assessment and Plan: Christopher Fuentes 53 y.o. male with stage IV colon cancer.  He has underwent chemotherapy with FOLFOX 6+ Avastin.  His CEA dropped from 120 to 11.6 after three cycles.  He completed 12 cycles of this therapy and restaging demonstrated lung nodules and liver lesions.  He will proceed with Avastin today along with beginning day 1 of his Xeloda today as per Dr. Laurelyn Sickle most recent note and recommendation.  His CBC is stable.  A CMP is pending.  His urine protein is negative.  We will draw a CEA at his next appointment.    The patient will return in one week for labs and an evaluation for any toxicities due to the oral Xeloda.  He knows to call us in the interim should he have any questions or concerns.  We can certainly see him sooner if needed.    I spent 25 minutes counseling the patient face to face.  The total time  spent in the appointment was 30 minutes.  Minette Headland, [place] [phone] 05/19/2013 4:25 PM

## 2013-05-19 ENCOUNTER — Ambulatory Visit (HOSPITAL_BASED_OUTPATIENT_CLINIC_OR_DEPARTMENT_OTHER): Payer: 59

## 2013-05-19 ENCOUNTER — Telehealth: Payer: Self-pay

## 2013-05-19 ENCOUNTER — Other Ambulatory Visit: Payer: 59

## 2013-05-19 ENCOUNTER — Ambulatory Visit (HOSPITAL_BASED_OUTPATIENT_CLINIC_OR_DEPARTMENT_OTHER): Payer: 59 | Admitting: Adult Health

## 2013-05-19 ENCOUNTER — Other Ambulatory Visit (HOSPITAL_BASED_OUTPATIENT_CLINIC_OR_DEPARTMENT_OTHER): Payer: 59

## 2013-05-19 ENCOUNTER — Encounter: Payer: Self-pay | Admitting: Adult Health

## 2013-05-19 VITALS — BP 119/76 | HR 87 | Temp 98.1°F | Resp 18 | Ht 71.0 in | Wt 222.5 lb

## 2013-05-19 VITALS — BP 120/73 | HR 85 | Temp 98.2°F | Resp 18

## 2013-05-19 DIAGNOSIS — C7800 Secondary malignant neoplasm of unspecified lung: Secondary | ICD-10-CM

## 2013-05-19 DIAGNOSIS — C185 Malignant neoplasm of splenic flexure: Secondary | ICD-10-CM

## 2013-05-19 DIAGNOSIS — Z5112 Encounter for antineoplastic immunotherapy: Secondary | ICD-10-CM

## 2013-05-19 DIAGNOSIS — C787 Secondary malignant neoplasm of liver and intrahepatic bile duct: Secondary | ICD-10-CM

## 2013-05-19 DIAGNOSIS — C189 Malignant neoplasm of colon, unspecified: Secondary | ICD-10-CM

## 2013-05-19 LAB — CBC WITH DIFFERENTIAL/PLATELET
BASO%: 2.1 % — ABNORMAL HIGH (ref 0.0–2.0)
BASOS ABS: 0.1 10*3/uL (ref 0.0–0.1)
EOS ABS: 0.1 10*3/uL (ref 0.0–0.5)
EOS%: 4.1 % (ref 0.0–7.0)
HCT: 40.1 % (ref 38.4–49.9)
HGB: 12.8 g/dL — ABNORMAL LOW (ref 13.0–17.1)
LYMPH#: 1.3 10*3/uL (ref 0.9–3.3)
LYMPH%: 35.5 % (ref 14.0–49.0)
MCH: 27.7 pg (ref 27.2–33.4)
MCHC: 32 g/dL (ref 32.0–36.0)
MCV: 86.6 fL (ref 79.3–98.0)
MONO#: 0.6 10*3/uL (ref 0.1–0.9)
MONO%: 18 % — ABNORMAL HIGH (ref 0.0–14.0)
NEUT%: 40.3 % (ref 39.0–75.0)
NEUTROS ABS: 1.5 10*3/uL (ref 1.5–6.5)
Platelets: 236 10*3/uL (ref 140–400)
RBC: 4.63 10*6/uL (ref 4.20–5.82)
RDW: 18.8 % — AB (ref 11.0–14.6)
WBC: 3.6 10*3/uL — ABNORMAL LOW (ref 4.0–10.3)

## 2013-05-19 LAB — COMPREHENSIVE METABOLIC PANEL (CC13)
ALBUMIN: 3.8 g/dL (ref 3.5–5.0)
ALT: 33 U/L (ref 0–55)
AST: 43 U/L — ABNORMAL HIGH (ref 5–34)
Alkaline Phosphatase: 79 U/L (ref 40–150)
Anion Gap: 13 mEq/L — ABNORMAL HIGH (ref 3–11)
BUN: 19.4 mg/dL (ref 7.0–26.0)
CALCIUM: 9.7 mg/dL (ref 8.4–10.4)
CHLORIDE: 108 meq/L (ref 98–109)
CO2: 22 meq/L (ref 22–29)
Creatinine: 1.4 mg/dL — ABNORMAL HIGH (ref 0.7–1.3)
GLUCOSE: 128 mg/dL (ref 70–140)
POTASSIUM: 3.7 meq/L (ref 3.5–5.1)
Sodium: 143 mEq/L (ref 136–145)
Total Bilirubin: 1.15 mg/dL (ref 0.20–1.20)
Total Protein: 7.7 g/dL (ref 6.4–8.3)

## 2013-05-19 LAB — UA PROTEIN, DIPSTICK - CHCC: Protein, ur: NEGATIVE mg/dL

## 2013-05-19 MED ORDER — GABAPENTIN 100 MG PO CAPS: 100.0000 mg | ORAL_CAPSULE | Freq: Three times a day (TID) | ORAL | Status: DC

## 2013-05-19 MED ORDER — SODIUM CHLORIDE 0.9 % IV SOLN
Freq: Once | INTRAVENOUS | Status: AC
  Administered 2013-05-19: 15:00:00 via INTRAVENOUS

## 2013-05-19 MED ORDER — HEPARIN SOD (PORK) LOCK FLUSH 100 UNIT/ML IV SOLN
500.0000 [IU] | Freq: Once | INTRAVENOUS | Status: AC | PRN
  Administered 2013-05-19: 500 [IU]
  Filled 2013-05-19: qty 5

## 2013-05-19 MED ORDER — SODIUM CHLORIDE 0.9 % IV SOLN
15.0000 mg/kg | Freq: Once | INTRAVENOUS | Status: AC
  Administered 2013-05-19: 1475 mg via INTRAVENOUS
  Filled 2013-05-19: qty 59

## 2013-05-19 MED ORDER — SODIUM CHLORIDE 0.9 % IJ SOLN
10.0000 mL | INTRAMUSCULAR | Status: DC | PRN
  Administered 2013-05-19: 10 mL
  Filled 2013-05-19: qty 10

## 2013-05-19 NOTE — Patient Instructions (Signed)
[facility] Discharge Instructions for Patients Receiving Chemotherapy  Today you received the following chemotherapy agents Avastin.  To help prevent nausea and vomiting after your treatment, we encourage you to take your nausea medication.   If you develop nausea and vomiting that is not controlled by your nausea medication, call the clinic.   BELOW ARE SYMPTOMS THAT SHOULD BE REPORTED IMMEDIATELY:  *FEVER GREATER THAN 100.5 F  *CHILLS WITH OR WITHOUT FEVER  NAUSEA AND VOMITING THAT IS NOT CONTROLLED WITH YOUR NAUSEA MEDICATION  *UNUSUAL SHORTNESS OF BREATH  *UNUSUAL BRUISING OR BLEEDING  TENDERNESS IN MOUTH AND THROAT WITH OR WITHOUT PRESENCE OF ULCERS  *URINARY PROBLEMS  *BOWEL PROBLEMS  UNUSUAL RASH Items with * indicate a potential emergency and should be followed up as soon as possible.  Feel free to call the clinic you have any questions or concerns. The clinic phone number is [phone].    

## 2013-05-19 NOTE — Telephone Encounter (Signed)
Anderson Malta called from [employer] about pt.  They have not been able to get hold of pt.  Prescription is on hold until they hear from him.  [place] notified.

## 2013-05-22 ENCOUNTER — Other Ambulatory Visit: Payer: Self-pay

## 2013-05-26 ENCOUNTER — Encounter: Payer: Self-pay | Admitting: Adult Health

## 2013-05-26 ENCOUNTER — Other Ambulatory Visit: Payer: Self-pay | Admitting: *Deleted

## 2013-05-26 ENCOUNTER — Other Ambulatory Visit: Payer: 59

## 2013-05-26 ENCOUNTER — Ambulatory Visit (HOSPITAL_BASED_OUTPATIENT_CLINIC_OR_DEPARTMENT_OTHER): Payer: 59 | Admitting: Adult Health

## 2013-05-26 ENCOUNTER — Ambulatory Visit (HOSPITAL_BASED_OUTPATIENT_CLINIC_OR_DEPARTMENT_OTHER): Payer: 59

## 2013-05-26 ENCOUNTER — Other Ambulatory Visit: Payer: Self-pay

## 2013-05-26 ENCOUNTER — Telehealth: Payer: Self-pay | Admitting: *Deleted

## 2013-05-26 VITALS — BP 128/78 | HR 60 | Temp 98.1°F | Resp 20 | Ht 71.0 in | Wt 224.0 lb

## 2013-05-26 DIAGNOSIS — C787 Secondary malignant neoplasm of liver and intrahepatic bile duct: Secondary | ICD-10-CM

## 2013-05-26 DIAGNOSIS — C185 Malignant neoplasm of splenic flexure: Secondary | ICD-10-CM

## 2013-05-26 DIAGNOSIS — C189 Malignant neoplasm of colon, unspecified: Secondary | ICD-10-CM

## 2013-05-26 DIAGNOSIS — C7800 Secondary malignant neoplasm of unspecified lung: Secondary | ICD-10-CM

## 2013-05-26 LAB — CBC WITH DIFFERENTIAL/PLATELET
BASO%: 1.3 % (ref 0.0–2.0)
Basophils Absolute: 0 10*3/uL (ref 0.0–0.1)
EOS ABS: 0.3 10*3/uL (ref 0.0–0.5)
EOS%: 7.3 % — ABNORMAL HIGH (ref 0.0–7.0)
HCT: 41.6 % (ref 38.4–49.9)
HEMOGLOBIN: 13.5 g/dL (ref 13.0–17.1)
LYMPH%: 37.6 % (ref 14.0–49.0)
MCH: 28.3 pg (ref 27.2–33.4)
MCHC: 32.5 g/dL (ref 32.0–36.0)
MCV: 87.3 fL (ref 79.3–98.0)
MONO#: 0.5 10*3/uL (ref 0.1–0.9)
MONO%: 15.1 % — ABNORMAL HIGH (ref 0.0–14.0)
NEUT%: 38.7 % — ABNORMAL LOW (ref 39.0–75.0)
NEUTROS ABS: 1.3 10*3/uL — AB (ref 1.5–6.5)
PLATELETS: 191 10*3/uL (ref 140–400)
RBC: 4.77 10*6/uL (ref 4.20–5.82)
RDW: 18 % — AB (ref 11.0–14.6)
WBC: 3.5 10*3/uL — ABNORMAL LOW (ref 4.0–10.3)
lymph#: 1.3 10*3/uL (ref 0.9–3.3)

## 2013-05-26 LAB — COMPREHENSIVE METABOLIC PANEL (CC13)
ALT: 27 U/L (ref 0–55)
AST: 36 U/L — ABNORMAL HIGH (ref 5–34)
Albumin: 3.8 g/dL (ref 3.5–5.0)
Alkaline Phosphatase: 70 U/L (ref 40–150)
Anion Gap: 11 mEq/L (ref 3–11)
BUN: 17.6 mg/dL (ref 7.0–26.0)
CO2: 22 meq/L (ref 22–29)
CREATININE: 1.3 mg/dL (ref 0.7–1.3)
Calcium: 9.6 mg/dL (ref 8.4–10.4)
Chloride: 105 mEq/L (ref 98–109)
Glucose: 111 mg/dl (ref 70–140)
Potassium: 4 mEq/L (ref 3.5–5.1)
Sodium: 139 mEq/L (ref 136–145)
Total Bilirubin: 0.95 mg/dL (ref 0.20–1.20)
Total Protein: 7.9 g/dL (ref 6.4–8.3)

## 2013-05-26 LAB — UA PROTEIN, DIPSTICK - CHCC: Protein, ur: NEGATIVE mg/dL

## 2013-05-26 LAB — CEA: CEA: 8.6 ng/mL — ABNORMAL HIGH (ref 0.0–5.0)

## 2013-05-26 MED ORDER — CAPECITABINE 500 MG PO TABS: 1000.0000 mg/m2 | ORAL_TABLET | Freq: Two times a day (BID) | ORAL | Status: DC

## 2013-05-26 NOTE — Telephone Encounter (Signed)
Called pt with lab results(CEA) and other labs CBC,CMP and communicated with pt to continue the Xeloda until we see him again. Pt verbalized understanding.

## 2013-05-26 NOTE — Telephone Encounter (Signed)
Prescription amount  corrected with pharmacist  [facility] with biologics.  Patient taking 10 tabs daily x 14 days which = 140 tabs not 112 tabs as prescribed earlier today.

## 2013-05-26 NOTE — Progress Notes (Signed)
Hematology and Oncology Follow Up Visit  Christopher Fuentes [id number] August 21, 1960 53 y.o. 05/27/2013 9:10 AM     Principle Diagnosis:Christopher Fuentes 53 y.o. male with stage IV colon cancer   Prior Therapy: #1 In December 2013 the patient developed weeks of mild abdominal pain and decreased appetite with nausea and vomiting. A CT in the ER revealed inflammatory colon mass at the splenic flexure with obvious metastasis to the liver. In 01/2012 he underwent surgery for a large bowel obstruction which consisted of a partial colectomy with site to site functional and end to end staple anastomosis. Pathology revealed 6.0 cm tumor, invasive adenocarcinoma, low grade, no lymphovascular invasion or perineural invasion. 0 of 15 lymph nodes were positive, margins were negative. Pathologic stage T3 N0. DNA MMR staining by immunohistochemistry was intact K.RAS mutation not found at exon 1 & 2 (including codone 12, 13, 14, 61).   #18 February 2012 CEA level was elevated at 120.   #3 In February 2014: CT chest, abdomen/pelvis revealed multiple bilateral lung masses, at least 5 lesions in each lung, 2 cm lesion noted in segment of liver, multiple small mesenteric lymph nodes which were stable and not pathologically enlarged no bony lesions.   #4 In February 2014 a right liver biopsy of segment 5 showed metastatic adenocarcinoma consistent with colon origin, positive for CK 20 CDX2 and villin negative for PSA TTF-1 and CK 7.   #5 In February 2014 adjuvant chemotherapy consisting of modified FOLFOX 6+ Avastin chemotherapy was started.   #6 In April 2014 CEA level 35 after third cycle of treatment. CT chest, abdomen/pelvis: Stable bilateral subcentimeter pulmonary nodules mild decrease in left lower lobe nodule decrease in size of lesion within the right hepatic lobe no new lesions seen   #22 Jun 2012 CEA level 11.6   #24 July 2012: Patient Completed eighth cycle of treatment with modified FOLFOX 6+ Avastin chemotherapy    #9 The patientCompleted 12 cycles of modified FOLFOX/Avastin with day 3 Neulasta on 12/05/2012.   #10 Restaging scan on 03/01/13 demonstrates at least 2 discrete foci of hypermetabolism in the liver, suspicious for metastatic disease. In addition, there are numerous bilateral pulmonary nodules. These pulmonary nodules are all subcentimeter in size, with the largest nodules measuring 8 mm in the left upper lobe and 7 mm in the right upper lobe, none of which demonstrate definite hypermetabolism on the PET portion of the examination. Despite the lack of hypermetabolism, these findings are highly concerning for pulmonary metastases (likely below the resolution of PET imaging). Attention on followup studies is recommended. Status post partial colectomy at the splenic flexure, without findings to suggest local recurrence of disease    Current therapy:  Avastin every 21 days, Xeloda days 1-14, currently day 8  Interim History: Christopher Fuentes 53 y.o. male with stage IV colon cancer.  He is doing very well.  He has been taking Xeloda BID and tolerating it without difficulty.  He denies fevers, chills, cracking of his hands and feet, mouth pain, diarrhea, nausea, vomiting, constipation, easy bruising/bleeding or any further concerns.  A 10 point ROS is negative.   Medications:  Current Outpatient Prescriptions  Medication Sig Dispense Refill  . ciprofloxacin (CIPRO) 500 MG tablet       . gabapentin (NEURONTIN) 100 MG capsule Take 1 capsule (100 mg total) by mouth 3 (three) times daily.  90 capsule  2  . losartan-hydrochlorothiazide (HYZAAR) 100-25 MG per tablet Take 1 tablet by mouth daily.  30 tablet  6  .  capecitabine (XELODA) 500 MG tablet Take 5 tablets (2,500 mg total) by mouth 2 (two) times daily after a meal. Take on days 1-14. Repeat every 21 days.  140 tablet  6   No current facility-administered medications for this visit.     Allergies:  Allergies  Allergen Reactions  . Other Hives and  Swelling    Sea food  . Shellfish Allergy     Medical History: Past Medical History  Diagnosis Date  . Colon cancer 01/2012    stage IV   . 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 Hypertension 11/18/2012    Surgical History:  Past Surgical History  Procedure Laterality Date  . Colon resection  01/2012     Review of Systems: A 10 point review of systems was conducted and is otherwise negative except for what is noted above.     Physical Exam: Blood pressure 128/78, pulse 60, temperature 98.1 F (36.7 C), temperature source Oral, resp. rate 20, height 5' 11"  (1.803 m), weight 224 lb (101.606 kg). GENERAL: Patient is a well appearing male in no acute distress HEENT:  Sclerae anicteric.  Oropharynx clear and moist. No ulcerations or evidence of oropharyngeal candidiasis. Neck is supple.  NODES:  No cervical, supraclavicular, or axillary lymphadenopathy palpated.  LUNGS:  Clear to auscultation bilaterally.  No wheezes or rhonchi. HEART:  Regular rate and rhythm. No murmur appreciated. ABDOMEN:  Soft, nontender.  Positive, normoactive bowel sounds. No organomegaly palpated. EXTREMITIES:  No peripheral edema.   SKIN:  Clear with no obvious rashes or skin changes. No nail dyscrasia. NEURO:  Nonfocal. Well oriented.  Appropriate affect. ECOG PERFORMANCE STATUS: 0 - Asymptomatic   Lab Results: Lab Results  Component Value Date   WBC 3.5* 05/26/2013   HGB 13.5 05/26/2013   HCT 41.6 05/26/2013   MCV 87.3 05/26/2013   PLT 191 05/26/2013     Chemistry      Component Value Date/Time   NA 139 05/26/2013 1007   K 4.0 05/26/2013 1007   CO2 22 05/26/2013 1007   BUN 17.6 05/26/2013 1007   CREATININE 1.3 05/26/2013 1007      Component Value Date/Time   CALCIUM 9.6 05/26/2013 1007   ALKPHOS 70 05/26/2013 1007   AST 36* 05/26/2013 1007   ALT 27 05/26/2013 1007   BILITOT 0.95 05/26/2013 1007     Assessment and Plan: Christopher Fuentes 53 y.o. male with   1. Stage IV colon  cancer on treatment with Avastin q21 days and Xeloda BID days 1-14.  See prior history for previous treatments.  He is currently day 8 of treatment.  He is tolerating treatment well.  A CBC is stable.  His CMET is pending.  He will continue Xeloda BID as long as his labs are stable.  He does inform me that he only has 5 pills left of his Xeloda.  He has not returned phone calls to [facility] to get his prescription refilled.  I will get this taken care of today.  He promises me that he will answer the phone and make arrangements for his Xeloda delivery when [facility] calls him. He may miss a dose or two of Xeloda due to this.  I did order a CEA for today and that is also pending.    The patient will return in one week for labs and evaluation.  He knows to contact us in the interim for any questions or concerns.  We can certainly see him sooner if needed.  I spent 25 minutes counseling the patient face to face.  The total time spent in the appointment was 30 minutes.  Minette Headland, [place] [phone] 05/27/2013 9:10 AM

## 2013-05-26 NOTE — Telephone Encounter (Signed)
Message copied by Harmon Pier on Fri May 26, 2013  4:36 PM ------      Message from: Minette Headland      Created: Fri May 26, 2013  4:06 PM       Please call patient and let him know that his CEA is stable.  His labs were also stable and he should continue Xeloda until his next appt.            Thanks, LC      ----- Message -----         From: [facility]         Sent: 05/26/2013   2:38 PM           To: Minette Headland, NP                   ------

## 2013-05-26 NOTE — Telephone Encounter (Signed)
This RN e-scribed Xeloda to [facility].

## 2013-06-01 ENCOUNTER — Telehealth: Payer: Self-pay

## 2013-06-01 NOTE — Telephone Encounter (Signed)
Returned patient call - per [facility] note pt to take Xeloda when received until his next appt.  Confirmed with pt that next appt is 4/24.  Pt voiced understanding.

## 2013-06-02 ENCOUNTER — Telehealth: Payer: Self-pay | Admitting: *Deleted

## 2013-06-02 NOTE — Telephone Encounter (Signed)
Called pt to verify that he has stopped taking Xeloda on day 14. Pt took Xeloda last on 06/01/13 which was his 14th day. Told pt he will see [place] again on the 24th and we will let him know how to proceed. Message to be forwarded to Charlestine Massed, NP.

## 2013-06-06 ENCOUNTER — Telehealth: Payer: Self-pay | Admitting: *Deleted

## 2013-06-06 NOTE — Telephone Encounter (Signed)
Reviewed schedule and due to pts diagnoses he can be seen by Dr. Benay Spice.  Went to Dr. Benay Spice for the ok and a date a time.  Called pt to make him aware of Dr. Humphrey Rolls and see if he was willing and able to see Dr. Benay Spice.  Pt agreed and I confirmed 06/08/13 lab appt and Dr. Gearldine Shown appt and 4/24 treatment appt.  Cancelled 4/24 lab and f/u appt.

## 2013-06-08 ENCOUNTER — Ambulatory Visit: Payer: 59 | Admitting: Oncology

## 2013-06-08 ENCOUNTER — Other Ambulatory Visit: Payer: Self-pay

## 2013-06-08 ENCOUNTER — Telehealth: Payer: Self-pay | Admitting: *Deleted

## 2013-06-08 ENCOUNTER — Other Ambulatory Visit: Payer: 59

## 2013-06-08 NOTE — Telephone Encounter (Signed)
Attempted to call pt to follow up on missed appt today. Left message requesting return call.

## 2013-06-09 ENCOUNTER — Other Ambulatory Visit: Payer: Self-pay | Admitting: *Deleted

## 2013-06-09 ENCOUNTER — Other Ambulatory Visit: Payer: 59

## 2013-06-09 ENCOUNTER — Ambulatory Visit: Payer: 59

## 2013-06-09 ENCOUNTER — Ambulatory Visit: Payer: 59 | Admitting: Oncology

## 2013-06-12 ENCOUNTER — Telehealth: Payer: Self-pay | Admitting: Oncology

## 2013-06-12 NOTE — Telephone Encounter (Signed)
s.w. pt and advised on 4.30.15 appt....pt ok and aware

## 2013-06-15 ENCOUNTER — Other Ambulatory Visit (HOSPITAL_BASED_OUTPATIENT_CLINIC_OR_DEPARTMENT_OTHER): Payer: 59

## 2013-06-15 ENCOUNTER — Ambulatory Visit (HOSPITAL_BASED_OUTPATIENT_CLINIC_OR_DEPARTMENT_OTHER): Payer: 59 | Admitting: Oncology

## 2013-06-15 ENCOUNTER — Telehealth: Payer: Self-pay | Admitting: Oncology

## 2013-06-15 ENCOUNTER — Ambulatory Visit (HOSPITAL_BASED_OUTPATIENT_CLINIC_OR_DEPARTMENT_OTHER): Payer: 59

## 2013-06-15 VITALS — BP 134/85 | HR 79 | Temp 97.5°F | Resp 18 | Ht 71.0 in | Wt 230.3 lb

## 2013-06-15 VITALS — BP 129/85 | HR 57

## 2013-06-15 DIAGNOSIS — C189 Malignant neoplasm of colon, unspecified: Secondary | ICD-10-CM

## 2013-06-15 DIAGNOSIS — C185 Malignant neoplasm of splenic flexure: Secondary | ICD-10-CM

## 2013-06-15 DIAGNOSIS — Z5112 Encounter for antineoplastic immunotherapy: Secondary | ICD-10-CM

## 2013-06-15 DIAGNOSIS — C787 Secondary malignant neoplasm of liver and intrahepatic bile duct: Secondary | ICD-10-CM

## 2013-06-15 DIAGNOSIS — C7800 Secondary malignant neoplasm of unspecified lung: Secondary | ICD-10-CM

## 2013-06-15 LAB — CBC WITH DIFFERENTIAL/PLATELET
BASO%: 0.2 % (ref 0.0–2.0)
BASOS ABS: 0 10*3/uL (ref 0.0–0.1)
EOS%: 7.9 % — ABNORMAL HIGH (ref 0.0–7.0)
Eosinophils Absolute: 0.3 10*3/uL (ref 0.0–0.5)
HCT: 40.9 % (ref 38.4–49.9)
HEMOGLOBIN: 13.2 g/dL (ref 13.0–17.1)
LYMPH#: 1.3 10*3/uL (ref 0.9–3.3)
LYMPH%: 37.6 % (ref 14.0–49.0)
MCH: 28.7 pg (ref 27.2–33.4)
MCHC: 32.2 g/dL (ref 32.0–36.0)
MCV: 89.2 fL (ref 79.3–98.0)
MONO#: 0.4 10*3/uL (ref 0.1–0.9)
MONO%: 11.4 % (ref 0.0–14.0)
NEUT#: 1.5 10*3/uL (ref 1.5–6.5)
NEUT%: 42.9 % (ref 39.0–75.0)
Platelets: 141 10*3/uL (ref 140–400)
RBC: 4.59 10*6/uL (ref 4.20–5.82)
RDW: 18.4 % — ABNORMAL HIGH (ref 11.0–14.6)
WBC: 3.4 10*3/uL — ABNORMAL LOW (ref 4.0–10.3)

## 2013-06-15 LAB — COMPREHENSIVE METABOLIC PANEL (CC13)
ALBUMIN: 3.7 g/dL (ref 3.5–5.0)
ALT: 50 U/L (ref 0–55)
AST: 58 U/L — ABNORMAL HIGH (ref 5–34)
Alkaline Phosphatase: 61 U/L (ref 40–150)
Anion Gap: 11 mEq/L (ref 3–11)
BUN: 21.2 mg/dL (ref 7.0–26.0)
CALCIUM: 9.8 mg/dL (ref 8.4–10.4)
CHLORIDE: 106 meq/L (ref 98–109)
CO2: 22 meq/L (ref 22–29)
Creatinine: 1.5 mg/dL — ABNORMAL HIGH (ref 0.7–1.3)
GLUCOSE: 159 mg/dL — AB (ref 70–140)
POTASSIUM: 3.7 meq/L (ref 3.5–5.1)
Sodium: 139 mEq/L (ref 136–145)
TOTAL PROTEIN: 7.6 g/dL (ref 6.4–8.3)
Total Bilirubin: 1.13 mg/dL (ref 0.20–1.20)

## 2013-06-15 LAB — UA PROTEIN, DIPSTICK - CHCC

## 2013-06-15 MED ORDER — SODIUM CHLORIDE 0.9 % IV SOLN
Freq: Once | INTRAVENOUS | Status: AC
  Administered 2013-06-15: 14:00:00 via INTRAVENOUS

## 2013-06-15 MED ORDER — CAPECITABINE 500 MG PO TABS: 2000.0000 mg | ORAL_TABLET | Freq: Two times a day (BID) | ORAL | Status: DC

## 2013-06-15 MED ORDER — SODIUM CHLORIDE 0.9 % IV SOLN
7.5000 mg/kg | Freq: Once | INTRAVENOUS | Status: AC
  Administered 2013-06-15: 775 mg via INTRAVENOUS
  Filled 2013-06-15: qty 31

## 2013-06-15 MED ORDER — HEPARIN SOD (PORK) LOCK FLUSH 100 UNIT/ML IV SOLN
500.0000 [IU] | Freq: Once | INTRAVENOUS | Status: AC | PRN
  Administered 2013-06-15: 500 [IU]
  Filled 2013-06-15: qty 5

## 2013-06-15 MED ORDER — SODIUM CHLORIDE 0.9 % IJ SOLN
10.0000 mL | INTRAMUSCULAR | Status: DC | PRN
  Administered 2013-06-15: 10 mL
  Filled 2013-06-15: qty 10

## 2013-06-15 NOTE — Patient Instructions (Signed)
[facility] Discharge Instructions for Patients Receiving Chemotherapy  Today you received the following chemotherapy agents Avastin.  To help prevent nausea and vomiting after your treatment, we encourage you to take your nausea medication as prescribed.   If you develop nausea and vomiting that is not controlled by your nausea medication, call the clinic.   BELOW ARE SYMPTOMS THAT SHOULD BE REPORTED IMMEDIATELY:  *FEVER GREATER THAN 100.5 F  *CHILLS WITH OR WITHOUT FEVER  NAUSEA AND VOMITING THAT IS NOT CONTROLLED WITH YOUR NAUSEA MEDICATION  *UNUSUAL SHORTNESS OF BREATH  *UNUSUAL BRUISING OR BLEEDING  TENDERNESS IN MOUTH AND THROAT WITH OR WITHOUT PRESENCE OF ULCERS  *URINARY PROBLEMS  *BOWEL PROBLEMS  UNUSUAL RASH Items with * indicate a potential emergency and should be followed up as soon as possible.  Feel free to call the clinic you have any questions or concerns. The clinic phone number is [phone].    

## 2013-06-15 NOTE — Telephone Encounter (Signed)
, °

## 2013-06-15 NOTE — [facility] Oncology Navigator Note (Signed)
Met with Christopher Fuentes. Explained role of nurse navigator .  He will now be under the oncology care of Dr. Benay Spice. [facility] resources provided to patient, including SW service and GI support group information.  Contact names and phone numbers were provided for entire [facility] team.  Teach back method was used.  No barriers to care identified.  Will continue to follow as needed.  Patient expressed appreciation for the visit.

## 2013-06-15 NOTE — Progress Notes (Signed)
  [facility] OFFICE PROGRESS NOTE   Diagnosis: Colon cancer  INTERVAL HISTORY:   He was diagnosed with metastatic colon cancer in December of 2013. She relocated to [facility] and has been followed by Dr.Kahn. He is currently being treated with Xeloda/Avastin, started 03/17/2013. He has been maintained off of Xeloda for the past month after developing neutropenia.  He was last treated with Avastin 05/19/2013.  He reports feeling well. He had soreness in the mouth following the most recent Xeloda. This has resolved. No diarrhea or nausea. No bleeding or symptoms of thrombosis. Good appetite. Neuropathy symptoms have resolved.  Objective:  Vital signs in last 24 hours:  Blood pressure 134/85, pulse 79, temperature 97.5 F (36.4 C), temperature source Oral, resp. rate 18, height $RemoveBe'5\' 11"'kpbQgzDBU$  (1.803 m), weight 230 lb 4.8 oz (104.463 kg), SpO2 98.00%.    HEENT: Hyperpigmentation at the pharynx, no thrush or ulcer Lymphatics: No cervical, supra-clavicular, axillary, or inguinal nodes Resp: Lungs clear bilaterally Cardio: Regular rate and rhythm GI: No hepatosplenomegaly, nontender, no mass Vascular: No leg edema  Skin: Hyperpigmentation and skin thickening over the hands and feet. No erythema or skin breakdown   Portacath/PICC-without erythema  Lab Results:  Lab Results  Component Value Date   WBC 3.4* 06/15/2013   HGB 13.2 06/15/2013   HCT 40.9 06/15/2013   MCV 89.2 06/15/2013   PLT 141 06/15/2013   NEUTROABS 1.5 06/15/2013    Lab Results  Component Value Date   CEA 8.6* 05/26/2013   Urine protein-<30   Medications: I have reviewed the patient's current medications.  Assessment/Plan:  1. Metastatic colon cancer diagnosed in December 2013, status post a partial colectomy for a T3 N0 tumor, K-ras wild-type bystander testing  CTs of the chest, abdomen, and pelvis February 2014 confirmed multiple bilateral lung masses, a liver lesion, and small mesenteric lymph  nodes-not pathologically enlarged  Segment 5 right liver biopsy February 2014 confirmed metastatic adenocarcinoma consistent with colon origin  FOLFOX 6+ Avastin February 2014 through 12/05/2012  Staging PET scan 03/01/2013 with 2 foci of hypermetabolism in the liver, numerous bilateral pulmonary nodules-subcentimeter in size, none of the pulmonary nodules were hypermetabolic but were highly suspicious for metastases  Initiation of Xeloda/Avastin on a 3 week schedule beginning 03/17/2013  2. Neutropenia February 2015-secondary to capecitabine  3. History of oxaliplatin neuropathy-improved  4. Hyperpigmentation secondary to 5-FU and capecitabine  Disposition:  Mr. Woodrome appears asymptomatic from the metastatic colon cancer. He appears to be tolerating the Xeloda well. I will decrease the Xeloda dose with this cycle secondary to the recent neutropenia and question of mucositis.  He will return for an office visit and CEA in 3 weeks. We will plan for a restaging CT or PET evaluation within the next few months.  I also adjusted the Avastin dose 7.5 mg per kilogram given every 3 weeks.      Ladell Pier, MD  06/15/2013  12:43 PM

## 2013-06-16 ENCOUNTER — Telehealth: Payer: Self-pay | Admitting: *Deleted

## 2013-06-16 ENCOUNTER — Other Ambulatory Visit: Payer: 59

## 2013-06-16 ENCOUNTER — Ambulatory Visit: Payer: 59 | Admitting: Adult Health

## 2013-06-16 ENCOUNTER — Ambulatory Visit: Payer: 59 | Admitting: Oncology

## 2013-06-16 NOTE — Telephone Encounter (Signed)
Can't make appointment today and needs to reschedule-left via VM Called back and left VM he has no appointment today-next appt. 06/30/13. Call back and ask for scheduler for any further questions regarding schedule. Reminded him to spell his name when he leaves message.

## 2013-06-16 NOTE — Telephone Encounter (Signed)
Per staff message and POF I have scheduled appts.  JMW  

## 2013-06-29 ENCOUNTER — Telehealth: Payer: Self-pay | Admitting: *Deleted

## 2013-06-29 NOTE — Telephone Encounter (Signed)
Left message on voicemail informing pt of canceled appt 5/15. Lab/MD/chemo due 07/06/13.

## 2013-06-30 ENCOUNTER — Ambulatory Visit: Payer: 59

## 2013-06-30 ENCOUNTER — Other Ambulatory Visit: Payer: 59

## 2013-07-02 ENCOUNTER — Other Ambulatory Visit: Payer: Self-pay | Admitting: Oncology

## 2013-07-06 ENCOUNTER — Telehealth: Payer: Self-pay | Admitting: Oncology

## 2013-07-06 ENCOUNTER — Telehealth: Payer: Self-pay | Admitting: *Deleted

## 2013-07-06 ENCOUNTER — Other Ambulatory Visit: Payer: Self-pay | Admitting: *Deleted

## 2013-07-06 ENCOUNTER — Other Ambulatory Visit (HOSPITAL_BASED_OUTPATIENT_CLINIC_OR_DEPARTMENT_OTHER): Payer: 59

## 2013-07-06 ENCOUNTER — Ambulatory Visit (HOSPITAL_BASED_OUTPATIENT_CLINIC_OR_DEPARTMENT_OTHER): Payer: 59

## 2013-07-06 ENCOUNTER — Ambulatory Visit (HOSPITAL_BASED_OUTPATIENT_CLINIC_OR_DEPARTMENT_OTHER): Payer: 59 | Admitting: Oncology

## 2013-07-06 VITALS — BP 128/82 | HR 68

## 2013-07-06 VITALS — BP 119/79 | HR 64 | Temp 97.5°F | Resp 18 | Ht 71.0 in | Wt 228.1 lb

## 2013-07-06 DIAGNOSIS — C189 Malignant neoplasm of colon, unspecified: Secondary | ICD-10-CM

## 2013-07-06 DIAGNOSIS — Z452 Encounter for adjustment and management of vascular access device: Secondary | ICD-10-CM

## 2013-07-06 DIAGNOSIS — C187 Malignant neoplasm of sigmoid colon: Secondary | ICD-10-CM

## 2013-07-06 DIAGNOSIS — C787 Secondary malignant neoplasm of liver and intrahepatic bile duct: Secondary | ICD-10-CM

## 2013-07-06 DIAGNOSIS — R911 Solitary pulmonary nodule: Secondary | ICD-10-CM

## 2013-07-06 DIAGNOSIS — Z5112 Encounter for antineoplastic immunotherapy: Secondary | ICD-10-CM

## 2013-07-06 DIAGNOSIS — D702 Other drug-induced agranulocytosis: Secondary | ICD-10-CM

## 2013-07-06 LAB — CBC WITH DIFFERENTIAL/PLATELET
BASO%: 0.9 % (ref 0.0–2.0)
Basophils Absolute: 0 10*3/uL (ref 0.0–0.1)
EOS ABS: 0.2 10*3/uL (ref 0.0–0.5)
EOS%: 6.8 % (ref 0.0–7.0)
HCT: 39.8 % (ref 38.4–49.9)
HGB: 13.6 g/dL (ref 13.0–17.1)
LYMPH#: 1.4 10*3/uL (ref 0.9–3.3)
LYMPH%: 52.6 % — ABNORMAL HIGH (ref 14.0–49.0)
MCH: 30.5 pg (ref 27.2–33.4)
MCHC: 34.1 g/dL (ref 32.0–36.0)
MCV: 89.4 fL (ref 79.3–98.0)
MONO#: 0.4 10*3/uL (ref 0.1–0.9)
MONO%: 16.4 % — AB (ref 0.0–14.0)
NEUT#: 0.6 10*3/uL — ABNORMAL LOW (ref 1.5–6.5)
NEUT%: 23.3 % — ABNORMAL LOW (ref 39.0–75.0)
Platelets: 163 10*3/uL (ref 140–400)
RBC: 4.46 10*6/uL (ref 4.20–5.82)
RDW: 16.7 % — AB (ref 11.0–14.6)
WBC: 2.6 10*3/uL — ABNORMAL LOW (ref 4.0–10.3)

## 2013-07-06 LAB — COMPREHENSIVE METABOLIC PANEL
ALK PHOS: 60 U/L (ref 39–117)
ALT: 26 U/L (ref 0–53)
AST: 39 U/L — AB (ref 0–37)
Albumin: 3.7 g/dL (ref 3.5–5.2)
BUN: 17 mg/dL (ref 6–23)
CO2: 22 mEq/L (ref 19–32)
Calcium: 8.9 mg/dL (ref 8.4–10.5)
Chloride: 103 mEq/L (ref 96–112)
Creatinine, Ser: 1.3 mg/dL (ref 0.50–1.35)
Glucose, Bld: 114 mg/dL — ABNORMAL HIGH (ref 70–99)
Potassium: 3.7 mEq/L (ref 3.5–5.3)
SODIUM: 141 meq/L (ref 135–145)
TOTAL PROTEIN: 7.2 g/dL (ref 6.0–8.3)
Total Bilirubin: 0.7 mg/dL (ref 0.3–1.2)

## 2013-07-06 LAB — UA PROTEIN, DIPSTICK - CHCC

## 2013-07-06 LAB — CEA: CEA: 16.3 ng/mL — ABNORMAL HIGH (ref 0.0–5.0)

## 2013-07-06 MED ORDER — CAPECITABINE 500 MG PO TABS: ORAL_TABLET | ORAL | Status: DC

## 2013-07-06 MED ORDER — HEPARIN SOD (PORK) LOCK FLUSH 100 UNIT/ML IV SOLN
500.0000 [IU] | Freq: Once | INTRAVENOUS | Status: AC | PRN
  Administered 2013-07-06: 500 [IU]
  Filled 2013-07-06: qty 5

## 2013-07-06 MED ORDER — SODIUM CHLORIDE 0.9 % IV SOLN
Freq: Once | INTRAVENOUS | Status: AC
  Administered 2013-07-06: 11:00:00 via INTRAVENOUS

## 2013-07-06 MED ORDER — SODIUM CHLORIDE 0.9 % IJ SOLN
10.0000 mL | INTRAMUSCULAR | Status: DC | PRN
  Administered 2013-07-06: 10 mL
  Filled 2013-07-06: qty 10

## 2013-07-06 MED ORDER — SODIUM CHLORIDE 0.9 % IV SOLN
7.5000 mg/kg | Freq: Once | INTRAVENOUS | Status: AC
  Administered 2013-07-06: 775 mg via INTRAVENOUS
  Filled 2013-07-06: qty 31

## 2013-07-06 MED ORDER — ALTEPLASE 2 MG IJ SOLR
2.0000 mg | Freq: Once | INTRAMUSCULAR | Status: AC | PRN
  Administered 2013-07-06: 2 mg
  Filled 2013-07-06: qty 2

## 2013-07-06 NOTE — Telephone Encounter (Signed)
Per staff message and POF I have scheduled appts.  JMW  

## 2013-07-06 NOTE — Progress Notes (Signed)
  Christopher Fuentes OFFICE PROGRESS NOTE   Diagnosis: Metastatic colon cancer  INTERVAL HISTORY:   He returns as scheduled. He completed the most recent cycle of capecitabine yesterday. The capecitabine was dose reduced with this cycle. No mouth sores, nausea, diarrhea, or hand/foot pain. He is using a moisturizer for the skin thickening of the hands and feet. No bleeding or sign of venous/arterial thrombosis.  Objective:  Vital signs in last 24 hours:  Blood pressure 119/79, pulse 64, temperature 97.5 F (36.4 C), temperature source Oral, resp. rate 18, height $RemoveBe'5\' 11"'vcBxmuKWz$  (1.803 m), weight 228 lb 1.6 oz (103.465 kg), SpO2 100.00%.    HEENT: No thrush or ulcers Resp: Lungs clear bilaterally Cardio: Regular rate and rhythm GI: No hepatomegaly, nontender Vascular: No leg edema  Skin: Skin thickening and hyperpigmentation at the hands and feet, no erythema or skin breakdown   Portacath/PICC-without erythema  Lab Results:  Lab Results  Component Value Date   WBC 2.6* 07/06/2013   HGB 13.6 07/06/2013   HCT 39.8 07/06/2013   MCV 89.4 07/06/2013   PLT 163 07/06/2013   NEUTROABS 0.6* 07/06/2013     Lab Results  Component Value Date   CEA 8.6* 05/26/2013    Imaging:  No results found.  Medications: I have reviewed the patient's current medications.  Assessment/Plan: 1. Metastatic colon cancer diagnosed in December 2013, status post a partial colectomy for a T3 N0 tumor, K-ras wild-type bystander testing  CTs of the chest, abdomen, and pelvis February 2014 confirmed multiple bilateral lung masses, a liver lesion, and small mesenteric lymph nodes-not pathologically enlarged  Segment 5 right liver biopsy February 2014 confirmed metastatic adenocarcinoma consistent with colon origin  FOLFOX 6+ Avastin February 2014 through 12/05/2012  Staging PET scan 03/01/2013 with 2 foci of hypermetabolism in the liver, numerous bilateral pulmonary nodules-subcentimeter in size, none of the  pulmonary nodules were hypermetabolic but were highly suspicious for metastases  Initiation of Xeloda/Avastin on a 3 week schedule beginning 03/17/2013 Neutropenia secondary to chemotherapy  Disposition:  He appears stable. The plan is to continue every three-week Avastin. Xeloda will be held today secondary to neutropenia. He will return for a lab visit in one week with the plan to resume Xeloda if the neutrophil count has recovered. We will dose reduce the Xeloda to 1500 mg twice daily with the next cycle.  He knows to contact us for a fever.  He will be scheduled for a restaging CT evaluation after the next office visit. Ladell Pier, MD  07/06/2013  8:54 AM

## 2013-07-06 NOTE — Telephone Encounter (Signed)
lvm for pt regarding to May appt... °

## 2013-07-06 NOTE — Telephone Encounter (Signed)
, °

## 2013-07-06 NOTE — Patient Instructions (Signed)
[facility] Discharge Instructions for Patients Receiving Chemotherapy  Today you received the following chemotherapy agents; Avastin.   To help prevent nausea and vomiting after your treatment, we encourage you to take your nausea medication as directed.    If you develop nausea and vomiting that is not controlled by your nausea medication, call the clinic.   BELOW ARE SYMPTOMS THAT SHOULD BE REPORTED IMMEDIATELY:  *FEVER GREATER THAN 100.5 F  *CHILLS WITH OR WITHOUT FEVER  NAUSEA AND VOMITING THAT IS NOT CONTROLLED WITH YOUR NAUSEA MEDICATION  *UNUSUAL SHORTNESS OF BREATH  *UNUSUAL BRUISING OR BLEEDING  TENDERNESS IN MOUTH AND THROAT WITH OR WITHOUT PRESENCE OF ULCERS  *URINARY PROBLEMS  *BOWEL PROBLEMS  UNUSUAL RASH Items with * indicate a potential emergency and should be followed up as soon as possible.  Feel free to call the clinic you have any questions or concerns. The clinic phone number is [phone].    

## 2013-07-07 ENCOUNTER — Telehealth: Payer: Self-pay | Admitting: Oncology

## 2013-07-07 NOTE — Telephone Encounter (Signed)
pt called to change time of lab appt on 5/26 to 3:45pm. per pt he cannot come in the AM and explained to provider that going forward any appt scheduled for him needs to be done in the PM.

## 2013-07-11 ENCOUNTER — Encounter: Payer: Self-pay | Admitting: Oncology

## 2013-07-11 ENCOUNTER — Other Ambulatory Visit (HOSPITAL_BASED_OUTPATIENT_CLINIC_OR_DEPARTMENT_OTHER): Payer: 59

## 2013-07-11 DIAGNOSIS — C189 Malignant neoplasm of colon, unspecified: Secondary | ICD-10-CM

## 2013-07-11 DIAGNOSIS — C787 Secondary malignant neoplasm of liver and intrahepatic bile duct: Secondary | ICD-10-CM

## 2013-07-11 DIAGNOSIS — C187 Malignant neoplasm of sigmoid colon: Secondary | ICD-10-CM

## 2013-07-11 LAB — CBC WITH DIFFERENTIAL/PLATELET
BASO%: 0.6 % (ref 0.0–2.0)
Basophils Absolute: 0 10*3/uL (ref 0.0–0.1)
EOS%: 6.2 % (ref 0.0–7.0)
Eosinophils Absolute: 0.3 10*3/uL (ref 0.0–0.5)
HCT: 39.6 % (ref 38.4–49.9)
HGB: 13.5 g/dL (ref 13.0–17.1)
LYMPH%: 42.7 % (ref 14.0–49.0)
MCH: 30.7 pg (ref 27.2–33.4)
MCHC: 34.1 g/dL (ref 32.0–36.0)
MCV: 89.8 fL (ref 79.3–98.0)
MONO#: 0.9 10*3/uL (ref 0.1–0.9)
MONO%: 20.7 % — ABNORMAL HIGH (ref 0.0–14.0)
NEUT%: 29.8 % — ABNORMAL LOW (ref 39.0–75.0)
NEUTROS ABS: 1.3 10*3/uL — AB (ref 1.5–6.5)
Platelets: 164 10*3/uL (ref 140–400)
RBC: 4.41 10*6/uL (ref 4.20–5.82)
RDW: 17.2 % — ABNORMAL HIGH (ref 11.0–14.6)
WBC: 4.3 10*3/uL (ref 4.0–10.3)
lymph#: 1.8 10*3/uL (ref 0.9–3.3)

## 2013-07-11 NOTE — Progress Notes (Signed)
Enrolled the patient in 1st step for Neulasta.

## 2013-07-12 ENCOUNTER — Telehealth: Payer: Self-pay | Admitting: *Deleted

## 2013-07-12 NOTE — Telephone Encounter (Signed)
Call from [facility] with [facility] asking if they should arrange for delivery of pt's Xeloda. Yes, pt to resume Xeloda at 1500 mg BID. They will contact pt to arrange shipment.

## 2013-07-12 NOTE — Telephone Encounter (Signed)
Left message on voicemail for pt to resume Xeloda at reduced dose of 3 tabs twice daily. Requested he call office to confirm instructions.

## 2013-07-12 NOTE — Telephone Encounter (Signed)
Message copied by Brien Few on Wed Jul 12, 2013  9:02 AM ------      Message from: Christopher Fuentes      Created: Tue Jul 11, 2013  9:36 PM       Please call patient, anc is better, resume xeloda at the reduced dose, f/u as scheduled, call for fever ------

## 2013-07-12 NOTE — Telephone Encounter (Signed)
Pt returned call, confirmed instructions to take reduced dose of Xeloda.

## 2013-07-14 ENCOUNTER — Telehealth: Payer: Self-pay | Admitting: *Deleted

## 2013-07-14 NOTE — Telephone Encounter (Signed)
Call from [place] with [facility] requesting new prescription because pt reports his dose has been decreased. Clarified that 5/21 prescription is the reduced dose. All other prescriptions with any refills should have been discontinued. Representative reports she canceled old prescriptions during call today. They will contact pt to arrange delivery of Xeloda 1,500 mg BID for 14 days then 7 days rest.

## 2013-07-17 NOTE — Telephone Encounter (Signed)
RECEIVED A FAX FROM [facility] CONCERNING A CONFIRMATION OF PRESCRIPTION SHIPMENT FOR CAPECITABINE ON 07/14/13.

## 2013-07-23 ENCOUNTER — Other Ambulatory Visit: Payer: Self-pay | Admitting: Oncology

## 2013-07-27 ENCOUNTER — Ambulatory Visit: Payer: 59

## 2013-07-27 ENCOUNTER — Ambulatory Visit (HOSPITAL_BASED_OUTPATIENT_CLINIC_OR_DEPARTMENT_OTHER): Payer: 59 | Admitting: Oncology

## 2013-07-27 ENCOUNTER — Ambulatory Visit (HOSPITAL_BASED_OUTPATIENT_CLINIC_OR_DEPARTMENT_OTHER): Payer: 59 | Admitting: Nurse Practitioner

## 2013-07-27 VITALS — BP 124/80 | HR 60 | Resp 18

## 2013-07-27 VITALS — BP 122/88 | HR 80 | Temp 98.0°F | Resp 19 | Ht 71.0 in | Wt 227.7 lb

## 2013-07-27 DIAGNOSIS — C187 Malignant neoplasm of sigmoid colon: Secondary | ICD-10-CM

## 2013-07-27 DIAGNOSIS — C189 Malignant neoplasm of colon, unspecified: Secondary | ICD-10-CM

## 2013-07-27 DIAGNOSIS — C787 Secondary malignant neoplasm of liver and intrahepatic bile duct: Secondary | ICD-10-CM

## 2013-07-27 DIAGNOSIS — D702 Other drug-induced agranulocytosis: Secondary | ICD-10-CM

## 2013-07-27 DIAGNOSIS — Z5112 Encounter for antineoplastic immunotherapy: Secondary | ICD-10-CM

## 2013-07-27 LAB — CBC WITH DIFFERENTIAL/PLATELET
BASO%: 1.1 % (ref 0.0–2.0)
Basophils Absolute: 0 10*3/uL (ref 0.0–0.1)
EOS ABS: 0.1 10*3/uL (ref 0.0–0.5)
EOS%: 2.5 % (ref 0.0–7.0)
HCT: 41.6 % (ref 38.4–49.9)
HGB: 15.1 g/dL (ref 13.0–17.1)
LYMPH%: 41.2 % (ref 14.0–49.0)
MCH: 32.8 pg (ref 27.2–33.4)
MCHC: 36.3 g/dL — ABNORMAL HIGH (ref 32.0–36.0)
MCV: 90.4 fL (ref 79.3–98.0)
MONO#: 0.8 10*3/uL (ref 0.1–0.9)
MONO%: 19.4 % — ABNORMAL HIGH (ref 0.0–14.0)
NEUT%: 35.8 % — AB (ref 39.0–75.0)
NEUTROS ABS: 1.4 10*3/uL — AB (ref 1.5–6.5)
Platelets: 177 10*3/uL (ref 140–400)
RBC: 4.61 10*6/uL (ref 4.20–5.82)
RDW: 16.5 % — ABNORMAL HIGH (ref 11.0–14.6)
WBC: 4 10*3/uL (ref 4.0–10.3)
lymph#: 1.6 10*3/uL (ref 0.9–3.3)

## 2013-07-27 LAB — UA PROTEIN, DIPSTICK - CHCC: Protein, ur: NEGATIVE mg/dL

## 2013-07-27 MED ORDER — BEVACIZUMAB CHEMO INJECTION 400 MG/16ML
7.5000 mg/kg | Freq: Once | INTRAVENOUS | Status: AC
  Administered 2013-07-27: 775 mg via INTRAVENOUS
  Filled 2013-07-27: qty 31

## 2013-07-27 MED ORDER — SODIUM CHLORIDE 0.9 % IJ SOLN
10.0000 mL | INTRAMUSCULAR | Status: DC | PRN
  Administered 2013-07-27: 10 mL
  Filled 2013-07-27: qty 10

## 2013-07-27 MED ORDER — SODIUM CHLORIDE 0.9 % IV SOLN
Freq: Once | INTRAVENOUS | Status: AC
  Administered 2013-07-27: 17:00:00 via INTRAVENOUS

## 2013-07-27 MED ORDER — HEPARIN SOD (PORK) LOCK FLUSH 100 UNIT/ML IV SOLN
500.0000 [IU] | Freq: Once | INTRAVENOUS | Status: AC | PRN
  Administered 2013-07-27: 500 [IU]
  Filled 2013-07-27: qty 5

## 2013-07-27 NOTE — Patient Instructions (Signed)
[facility] Discharge Instructions for Patients Receiving Chemotherapy  Today you received the following chemotherapy agents Avastin.  To help prevent nausea and vomiting after your treatment, we encourage you to take your nausea medication.   If you develop nausea and vomiting that is not controlled by your nausea medication, call the clinic.   BELOW ARE SYMPTOMS THAT SHOULD BE REPORTED IMMEDIATELY:  *FEVER GREATER THAN 100.5 F  *CHILLS WITH OR WITHOUT FEVER  NAUSEA AND VOMITING THAT IS NOT CONTROLLED WITH YOUR NAUSEA MEDICATION  *UNUSUAL SHORTNESS OF BREATH  *UNUSUAL BRUISING OR BLEEDING  TENDERNESS IN MOUTH AND THROAT WITH OR WITHOUT PRESENCE OF ULCERS  *URINARY PROBLEMS  *BOWEL PROBLEMS  UNUSUAL RASH Items with * indicate a potential emergency and should be followed up as soon as possible.  Feel free to call the clinic you have any questions or concerns. The clinic phone number is [phone].    

## 2013-07-27 NOTE — Progress Notes (Signed)
  [facility] OFFICE PROGRESS NOTE   Diagnosis:  Metastatic colon cancer.  INTERVAL HISTORY:   Mr. Mihalik returns as scheduled. He recently resumed Xeloda at a reduced dose. He is unsure of the start date. He denies nausea/vomiting. No mouth sores. No diarrhea. He denies hand or foot pain or redness. He notes darkening over the hands. No abdominal pain except a few days of "gas pain". He denies bleeding. No shortness of breath or chest pain. No leg swelling or calf pain.  Objective:  Vital signs in last 24 hours:  Blood pressure 122/88, pulse 80, temperature 98 F (36.7 C), temperature source Oral, resp. rate 19, height _0  (1.803 m), weight 227 lb 11.2 oz (103.284 kg), SpO2 98.00%.    HEENT: No thrush or ulcerations. Resp: Lungs clear. Cardio: Regular cardiac rhythm. GI: Abdomen soft and nontender. No hepatomegaly. Vascular: No leg edema. Calves nontender. Skin: Hands with skin thickening and hyperpigmentation. No erythema or skin breakdown.  Port-A-Cath site without erythema.    Lab Results:  Lab Results  Component Value Date   WBC 4.0 07/27/2013   HGB 15.1 07/27/2013   HCT 41.6 07/27/2013   MCV 90.4 07/27/2013   PLT 177 07/27/2013   NEUTROABS 1.4* 07/27/2013    Imaging:  No results found.  Medications: I have reviewed the patient's current medications.  Assessment/Plan: 1. Metastatic colon cancer diagnosed in December 2013, status post a partial colectomy for a T3 N0 tumor, K-ras wild-type bystander testing.   CTs of the chest, abdomen, and pelvis February 2014 confirmed multiple bilateral lung masses, a liver lesion, and small mesenteric lymph nodes-not pathologically enlarged.   Segment 5 right liver biopsy February 2014 confirmed metastatic adenocarcinoma consistent with colon origin.   FOLFOX 6+ Avastin February 2014 through 12/05/2012.   Staging PET scan 03/01/2013 with 2 foci of hypermetabolism in the liver, numerous bilateral pulmonary  nodules-subcentimeter in size, none of the pulmonary nodules were hypermetabolic but were highly suspicious for metastases.   Initiation of Xeloda/Avastin on a 3 week schedule beginning 03/17/2013.  2. Neutropenia secondary to chemotherapy 07/06/2013. Improved 07/11/2013. Xeloda dose reduced to 1500 mg twice daily.   Disposition: Mr. Escoe appears well. The neutrophil count is stable. He will continue Xeloda and every 3 week Avastin. We will followup on the CEA from today.   We referred him for a restaging CT evaluation in the next few weeks. He has a shellfish allergy (hives) and will likely require premedication. We will discuss with radiology.   He will return for a followup visit in 3 weeks to review the results. He will contact the office in the interim with any problems.    Ned Card ANP/GNP-BC   07/27/2013  4:10 PM

## 2013-07-28 ENCOUNTER — Telehealth: Payer: Self-pay | Admitting: *Deleted

## 2013-07-28 DIAGNOSIS — C189 Malignant neoplasm of colon, unspecified: Secondary | ICD-10-CM

## 2013-07-28 LAB — CEA: CEA: 21.2 ng/mL — ABNORMAL HIGH (ref 0.0–5.0)

## 2013-07-28 MED ORDER — CAPECITABINE 500 MG PO TABS: ORAL_TABLET | ORAL | Status: DC

## 2013-07-28 MED ORDER — PREDNISONE 50 MG PO TABS: ORAL_TABLET | ORAL | Status: AC

## 2013-07-28 NOTE — Telephone Encounter (Addendum)
Called patient to see how many Xeloda pills he had left.  Per Christopher Fuentes. Christopher Fuentes.  Patient stated that he had 33 pills left.  Informed Christopher Fuentes. Christopher Fuentes.  Informed by Christopher Fuentes. Christopher Fuentes to do a new prescription with the start date of 08/12/13.

## 2013-07-28 NOTE — Telephone Encounter (Signed)
Faxed Xeloda prescription to [facility].

## 2013-07-28 NOTE — Telephone Encounter (Signed)
Called to inform patient that radiology recommends  he takes prednisone and benadryl before CT scan because of shellfish allergy.  Informed patient to take Prednisone 50 mg po 13 hours, 7 hours, and 1 hour before CT scan and Bendaryl 50 mg po 1 hour before CT scan. Informed patient that he could get benadryl OTC.  Per Elby Showers. Thomas.  Patient verbalized understanding.

## 2013-07-31 ENCOUNTER — Telehealth: Payer: Self-pay | Admitting: *Deleted

## 2013-07-31 NOTE — Telephone Encounter (Signed)
I have adjusted 7/2 appt. Advised scheduler to move lab appt

## 2013-08-02 ENCOUNTER — Telehealth: Payer: Self-pay | Admitting: Oncology

## 2013-08-02 NOTE — Telephone Encounter (Signed)
Called pt,left message regarding appt for july ,mailed appt

## 2013-08-03 ENCOUNTER — Telehealth: Payer: Self-pay | Admitting: *Deleted

## 2013-08-03 NOTE — Telephone Encounter (Signed)
Left VM requesting nurse call him about his CT scan on 08/14/13 in regards to a call from his insurance company. Called back and left message that he needs to ask to speak to our precert coordinator, Benedetto Goad when he calls back.

## 2013-08-09 ENCOUNTER — Encounter: Payer: Self-pay | Admitting: Oncology

## 2013-08-09 NOTE — Progress Notes (Signed)
Patient was approved via [place] for Avastin. I will send to billing and medical records.

## 2013-08-10 ENCOUNTER — Encounter: Payer: Self-pay | Admitting: *Deleted

## 2013-08-10 NOTE — Progress Notes (Signed)
RECEIVED A FAX FROM [employer] CONCERNING A CONFIRMATION OF PRESCRIPTION SHIPMENT FOR CAPECITABINE ON 08/09/13.

## 2013-08-13 ENCOUNTER — Other Ambulatory Visit: Payer: Self-pay | Admitting: Oncology

## 2013-08-14 ENCOUNTER — Ambulatory Visit (HOSPITAL_COMMUNITY)
Admission: RE | Admit: 2013-08-14 | Discharge: 2013-08-14 | Disposition: A | Discharge status: Home / Self Care | Payer: 59 | Source: Ambulatory Visit | Attending: Nurse Practitioner | Admitting: Nurse Practitioner

## 2013-08-14 ENCOUNTER — Encounter (HOSPITAL_COMMUNITY): Payer: Self-pay

## 2013-08-14 DIAGNOSIS — C787 Secondary malignant neoplasm of liver and intrahepatic bile duct: Secondary | ICD-10-CM | POA: Insufficient documentation

## 2013-08-14 DIAGNOSIS — Z9221 Personal history of antineoplastic chemotherapy: Secondary | ICD-10-CM | POA: Insufficient documentation

## 2013-08-14 DIAGNOSIS — C189 Malignant neoplasm of colon, unspecified: Secondary | ICD-10-CM | POA: Insufficient documentation

## 2013-08-14 DIAGNOSIS — I10 Essential (primary) hypertension: Secondary | ICD-10-CM | POA: Insufficient documentation

## 2013-08-14 DIAGNOSIS — R918 Other nonspecific abnormal finding of lung field: Secondary | ICD-10-CM | POA: Insufficient documentation

## 2013-08-14 MED ORDER — IOHEXOL 300 MG/ML  SOLN
100.0000 mL | Freq: Once | INTRAMUSCULAR | Status: AC | PRN
  Administered 2013-08-14: 100 mL via INTRAVENOUS

## 2013-08-15 ENCOUNTER — Telehealth: Payer: Self-pay | Admitting: *Deleted

## 2013-08-15 ENCOUNTER — Telehealth: Payer: Self-pay | Admitting: Oncology

## 2013-08-15 NOTE — Telephone Encounter (Signed)
Per scheduler I have moved appt from 7/2 to 7/1

## 2013-08-15 NOTE — Telephone Encounter (Signed)
Talked to pt and gave him appt for lab and Md  r/s fromm 7/2 per MD

## 2013-08-16 ENCOUNTER — Other Ambulatory Visit: Payer: 59

## 2013-08-16 ENCOUNTER — Telehealth: Payer: Self-pay | Admitting: Oncology

## 2013-08-16 ENCOUNTER — Ambulatory Visit: Payer: 59

## 2013-08-16 ENCOUNTER — Ambulatory Visit: Payer: 59 | Admitting: Nurse Practitioner

## 2013-08-16 NOTE — Telephone Encounter (Signed)
Called pt, FTKA today left message to call and r/s appt

## 2013-08-17 ENCOUNTER — Ambulatory Visit: Payer: 59 | Admitting: Oncology

## 2013-08-17 ENCOUNTER — Ambulatory Visit: Payer: 59

## 2013-08-17 ENCOUNTER — Other Ambulatory Visit: Payer: 59

## 2013-08-17 ENCOUNTER — Telehealth: Payer: Self-pay | Admitting: *Deleted

## 2013-08-17 NOTE — Telephone Encounter (Signed)
Left VM returning office call to reschedule the appointment he missed on 08/16/13. Forwarded message to scheduler.

## 2013-08-21 ENCOUNTER — Telehealth: Payer: Self-pay | Admitting: Oncology

## 2013-08-21 ENCOUNTER — Other Ambulatory Visit (HOSPITAL_BASED_OUTPATIENT_CLINIC_OR_DEPARTMENT_OTHER): Payer: 59

## 2013-08-21 ENCOUNTER — Ambulatory Visit (HOSPITAL_BASED_OUTPATIENT_CLINIC_OR_DEPARTMENT_OTHER): Payer: 59

## 2013-08-21 ENCOUNTER — Telehealth: Payer: Self-pay | Admitting: *Deleted

## 2013-08-21 ENCOUNTER — Ambulatory Visit: Payer: 59 | Admitting: Nurse Practitioner

## 2013-08-21 ENCOUNTER — Ambulatory Visit (HOSPITAL_BASED_OUTPATIENT_CLINIC_OR_DEPARTMENT_OTHER): Payer: 59 | Admitting: Nurse Practitioner

## 2013-08-21 VITALS — BP 129/84 | HR 85 | Temp 98.5°F | Resp 18 | Ht 71.0 in | Wt 223.6 lb

## 2013-08-21 VITALS — BP 125/77 | HR 61

## 2013-08-21 DIAGNOSIS — C787 Secondary malignant neoplasm of liver and intrahepatic bile duct: Secondary | ICD-10-CM

## 2013-08-21 DIAGNOSIS — C189 Malignant neoplasm of colon, unspecified: Secondary | ICD-10-CM

## 2013-08-21 DIAGNOSIS — C187 Malignant neoplasm of sigmoid colon: Secondary | ICD-10-CM

## 2013-08-21 DIAGNOSIS — R911 Solitary pulmonary nodule: Secondary | ICD-10-CM

## 2013-08-21 DIAGNOSIS — Z5112 Encounter for antineoplastic immunotherapy: Secondary | ICD-10-CM

## 2013-08-21 LAB — CBC WITH DIFFERENTIAL/PLATELET
BASO%: 0.5 % (ref 0.0–2.0)
Basophils Absolute: 0 10*3/uL (ref 0.0–0.1)
EOS%: 13.9 % — ABNORMAL HIGH (ref 0.0–7.0)
Eosinophils Absolute: 0.4 10*3/uL (ref 0.0–0.5)
HEMATOCRIT: 42.1 % (ref 38.4–49.9)
HGB: 15.2 g/dL (ref 13.0–17.1)
LYMPH%: 37.2 % (ref 14.0–49.0)
MCH: 33 pg (ref 27.2–33.4)
MCHC: 36.1 g/dL — AB (ref 32.0–36.0)
MCV: 91.6 fL (ref 79.3–98.0)
MONO#: 0.3 10*3/uL (ref 0.1–0.9)
MONO%: 9.4 % (ref 0.0–14.0)
NEUT%: 39 % (ref 39.0–75.0)
NEUTROS ABS: 1.2 10*3/uL — AB (ref 1.5–6.5)
Platelets: 166 10*3/uL (ref 140–400)
RBC: 4.6 10*6/uL (ref 4.20–5.82)
RDW: 17.8 % — ABNORMAL HIGH (ref 11.0–14.6)
WBC: 3 10*3/uL — AB (ref 4.0–10.3)
lymph#: 1.1 10*3/uL (ref 0.9–3.3)

## 2013-08-21 LAB — COMPREHENSIVE METABOLIC PANEL
ALT: 41 U/L (ref 0–53)
AST: 48 U/L — AB (ref 0–37)
Albumin: 3.9 g/dL (ref 3.5–5.2)
Alkaline Phosphatase: 76 U/L (ref 39–117)
BUN: 20 mg/dL (ref 6–23)
CO2: 18 mEq/L — ABNORMAL LOW (ref 19–32)
Calcium: 9.6 mg/dL (ref 8.4–10.5)
Chloride: 99 mEq/L (ref 96–112)
Creatinine, Ser: 1.31 mg/dL (ref 0.50–1.35)
Glucose, Bld: 127 mg/dL — ABNORMAL HIGH (ref 70–99)
POTASSIUM: 3.7 meq/L (ref 3.5–5.3)
SODIUM: 137 meq/L (ref 135–145)
TOTAL PROTEIN: 7.5 g/dL (ref 6.0–8.3)
Total Bilirubin: 0.6 mg/dL (ref 0.2–1.2)

## 2013-08-21 LAB — UA PROTEIN, DIPSTICK - CHCC: PROTEIN: 30 mg/dL

## 2013-08-21 MED ORDER — ALTEPLASE 2 MG IJ SOLR
2.0000 mg | Freq: Once | INTRAMUSCULAR | Status: DC | PRN
  Filled 2013-08-21: qty 2

## 2013-08-21 MED ORDER — EPINEPHRINE HCL 1 MG/ML IJ SOLN: 0.5000 mg | Freq: Once | INTRAMUSCULAR | Status: DC | PRN

## 2013-08-21 MED ORDER — DIPHENHYDRAMINE HCL 50 MG/ML IJ SOLN
25.0000 mg | Freq: Once | INTRAMUSCULAR | Status: DC | PRN
Start: 2013-08-21 — End: 2013-08-21

## 2013-08-21 MED ORDER — SODIUM CHLORIDE 0.9 % IJ SOLN
10.0000 mL | INTRAMUSCULAR | Status: DC | PRN
  Administered 2013-08-21: 10 mL
  Filled 2013-08-21: qty 10

## 2013-08-21 MED ORDER — FAMOTIDINE IN NACL 20-0.9 MG/50ML-% IV SOLN: 20.0000 mg | Freq: Once | INTRAVENOUS | Status: DC | PRN

## 2013-08-21 MED ORDER — HEPARIN SOD (PORK) LOCK FLUSH 100 UNIT/ML IV SOLN
500.0000 [IU] | Freq: Once | INTRAVENOUS | Status: AC | PRN
  Administered 2013-08-21: 500 [IU]
  Filled 2013-08-21: qty 5

## 2013-08-21 MED ORDER — METHYLPREDNISOLONE SODIUM SUCC 125 MG IJ SOLR: 125.0000 mg | Freq: Once | INTRAMUSCULAR | Status: DC | PRN

## 2013-08-21 MED ORDER — HEPARIN SOD (PORK) LOCK FLUSH 100 UNIT/ML IV SOLN
250.0000 [IU] | Freq: Once | INTRAVENOUS | Status: DC | PRN
  Filled 2013-08-21: qty 5

## 2013-08-21 MED ORDER — SODIUM CHLORIDE 0.9 % IV SOLN: Freq: Once | INTRAVENOUS | Status: DC | PRN

## 2013-08-21 MED ORDER — DIPHENHYDRAMINE HCL 50 MG/ML IJ SOLN: 50.0000 mg | Freq: Once | INTRAMUSCULAR | Status: DC | PRN

## 2013-08-21 MED ORDER — ALBUTEROL SULFATE (2.5 MG/3ML) 0.083% IN NEBU
2.5000 mg | INHALATION_SOLUTION | Freq: Once | RESPIRATORY_TRACT | Status: DC | PRN
  Filled 2013-08-21: qty 3

## 2013-08-21 MED ORDER — BEVACIZUMAB CHEMO INJECTION 400 MG/16ML
7.5000 mg/kg | Freq: Once | INTRAVENOUS | Status: AC
  Administered 2013-08-21: 775 mg via INTRAVENOUS
  Filled 2013-08-21: qty 31

## 2013-08-21 MED ORDER — SODIUM CHLORIDE 0.9 % IV SOLN
Freq: Once | INTRAVENOUS | Status: AC
  Administered 2013-08-21: 16:00:00 via INTRAVENOUS

## 2013-08-21 MED ORDER — EPINEPHRINE HCL 0.1 MG/ML IJ SOSY
0.2500 mg | PREFILLED_SYRINGE | Freq: Once | INTRAMUSCULAR | Status: DC | PRN
  Filled 2013-08-21: qty 10

## 2013-08-21 MED ORDER — SODIUM CHLORIDE 0.9 % IJ SOLN
3.0000 mL | INTRAMUSCULAR | Status: DC | PRN
  Filled 2013-08-21: qty 10

## 2013-08-21 NOTE — Telephone Encounter (Signed)
Per staff message and POF I have scheduled appts. Advised scheduler of appts. JMW  

## 2013-08-21 NOTE — Patient Instructions (Signed)
[facility] Discharge Instructions for Patients Receiving Chemotherapy  Today you received the following chemotherapy agents  avastin  To help prevent nausea and vomiting after your treatment, we encourage you to take your nausea medication.   If you develop nausea and vomiting that is not controlled by your nausea medication, call the clinic.   BELOW ARE SYMPTOMS THAT SHOULD BE REPORTED IMMEDIATELY:  *FEVER GREATER THAN 100.5 F  *CHILLS WITH OR WITHOUT FEVER  NAUSEA AND VOMITING THAT IS NOT CONTROLLED WITH YOUR NAUSEA MEDICATION  *UNUSUAL SHORTNESS OF BREATH  *UNUSUAL BRUISING OR BLEEDING  TENDERNESS IN MOUTH AND THROAT WITH OR WITHOUT PRESENCE OF ULCERS  *URINARY PROBLEMS  *BOWEL PROBLEMS  UNUSUAL RASH Items with * indicate a potential emergency and should be followed up as soon as possible.  Feel free to call the clinic you have any questions or concerns. The clinic phone number is [phone].    

## 2013-08-21 NOTE — Progress Notes (Addendum)
  [place] OFFICE PROGRESS NOTE   Diagnosis:  Colon cancer.  INTERVAL HISTORY:   Christopher Fuentes returns after missing a recent followup appointment. He started the most recent cycle of Xeloda on 08/14/2013. He denies nausea/vomiting. No mouth sores. No diarrhea. He denies hand or foot pain or redness. No bleeding. No shortness of breath or chest pain. He denies leg swelling or calf pain. He has intermittent "gas" discomfort.  Objective:  Vital signs in last 24 hours:  Blood pressure 129/84, pulse 85, temperature 98.5 F (36.9 C), temperature source Oral, resp. rate 18, height $RemoveBe'5\' 11"'SHLQbnbjN$  (1.803 m), weight 223 lb 9.6 oz (101.424 kg).    HEENT: No thrush or ulcerations. Resp: Lungs clear. Cardio: Regular cardiac rhythm. GI: Abdomen soft and nontender. No hepatomegaly. Vascular: No leg edema. Calves nontender. Skin: Palms with hyperpigmentation. No erythema or skin breakdown.  Port-A-Cath site without erythema.    Lab Results:  Lab Results  Component Value Date   WBC 3.0* 08/21/2013   HGB 15.2 08/21/2013   HCT 42.1 08/21/2013   MCV 91.6 08/21/2013   PLT 166 08/21/2013   NEUTROABS 1.2* 08/21/2013    Imaging:  No results found.  Medications: I have reviewed the patient's current medications.  Assessment/Plan: 1. Metastatic colon cancer diagnosed in December 2013, status post a partial colectomy for a T3 N0 tumor, K-ras wild-type by standard testing.  CTs of the chest, abdomen, and pelvis February 2014 confirmed multiple bilateral lung masses, a liver lesion, and small mesenteric lymph nodes-not pathologically enlarged.  Segment 5 right liver biopsy February 2014 confirmed metastatic adenocarcinoma consistent with colon origin.  FOLFOX 6+ Avastin February 2014 through 12/05/2012.  Staging PET scan 03/01/2013 with 2 foci of hypermetabolism in the liver, numerous bilateral pulmonary nodules-subcentimeter in size, none of the pulmonary nodules were hypermetabolic but were  highly suspicious for metastases.  Initiation of Xeloda/Avastin on a 3 week schedule beginning 03/17/2013.  Restaging CT chest/abdomen/pelvis 08/14/2013. Index lesion right upper lobe 8 mm previously 7 mm; index left upper lobe lesion 10 mm previously 8 mm; some additional nodules in both lungs showed general stability with additional nodules in the lower lobes not definitely visualized on the prior exam which was limited by atelectasis. Foci of hypermetabolism within liver on prior PET CT not identified on current CT exam. 2. Neutropenia secondary to chemotherapy 07/06/2013. Improved 07/11/2013. Xeloda dose reduced to 1500 mg twice daily.    Disposition: Christopher Fuentes appears stable. The restaging CT evaluation showed several lung nodules to be slightly larger, others stable. Dr. Benay Spice reviewed the CT result with him at today's visit.  Dr. Benay Spice recommends continuation of Xeloda/Avastin on the current schedule with close followup of the CEA and repeat CT evaluation at a three-month interval.  We will see him in followup in 3 weeks. He will contact the office in the interim with any problems.  Patient seen with Dr. Benay Spice.    Ned Card ANP/GNP-BC   08/21/2013  4:43 PM  This was a shared visit with Ned Card. The restaging CT reveals essentially stable disease with a slight increased size of some of the lung nodules and no tumor seen in the liver. The CEA is slightly higher. We discussed the CT findings with Christopher Fuentes.. We decided to continue Xeloda/Avastin. The plan is to switch to irinotecan-based therapy if he has clear disease progression.  Julieanne Manson, M.D.

## 2013-08-21 NOTE — Telephone Encounter (Signed)
Gave pt appt today lab,md and chemo for July 2015

## 2013-08-21 NOTE — Telephone Encounter (Signed)
Gave pt appt for lab MD and chemo for 7/28, ok per Dr Benay Spice due to pt's work

## 2013-08-29 ENCOUNTER — Other Ambulatory Visit: Payer: Self-pay | Admitting: *Deleted

## 2013-08-29 DIAGNOSIS — C189 Malignant neoplasm of colon, unspecified: Secondary | ICD-10-CM

## 2013-08-29 MED ORDER — CAPECITABINE 500 MG PO TABS: 1500.0000 mg | ORAL_TABLET | Freq: Two times a day (BID) | ORAL | Status: DC

## 2013-08-29 NOTE — Telephone Encounter (Signed)
THIS REFILL REQUEST FOR CAPECITABINE WAS GIVEN TO DR.SHERRILL'S NURSE, TANYA WHITLOCK,RN. 

## 2013-08-30 NOTE — Telephone Encounter (Signed)
Fax received from [employer] confirming referal receipt for xeloda.  Sent to scan.

## 2013-09-01 NOTE — Telephone Encounter (Signed)
RECEIVED A FAX FROM [employer] CONCERNING A CONFIRMATION OF PRESCRIPTION SHIPMENT FOR CAPECITABINE ON 08/31/13.

## 2013-09-04 ENCOUNTER — Encounter: Payer: Self-pay | Admitting: Oncology

## 2013-09-04 NOTE — Progress Notes (Signed)
Per billing the date of service 04/28/13 is patient resp. No asst per [employer] for $98.69

## 2013-09-12 ENCOUNTER — Other Ambulatory Visit: Payer: Self-pay | Admitting: Oncology

## 2013-09-12 ENCOUNTER — Ambulatory Visit (HOSPITAL_BASED_OUTPATIENT_CLINIC_OR_DEPARTMENT_OTHER): Payer: 59

## 2013-09-12 ENCOUNTER — Telehealth: Payer: Self-pay | Admitting: Oncology

## 2013-09-12 ENCOUNTER — Telehealth: Payer: Self-pay | Admitting: *Deleted

## 2013-09-12 ENCOUNTER — Other Ambulatory Visit (HOSPITAL_BASED_OUTPATIENT_CLINIC_OR_DEPARTMENT_OTHER): Payer: 59

## 2013-09-12 ENCOUNTER — Ambulatory Visit (HOSPITAL_BASED_OUTPATIENT_CLINIC_OR_DEPARTMENT_OTHER): Payer: 59 | Admitting: Nurse Practitioner

## 2013-09-12 VITALS — BP 117/71 | HR 73 | Temp 98.3°F | Resp 18 | Ht 71.0 in | Wt 218.0 lb

## 2013-09-12 DIAGNOSIS — C189 Malignant neoplasm of colon, unspecified: Secondary | ICD-10-CM

## 2013-09-12 DIAGNOSIS — C187 Malignant neoplasm of sigmoid colon: Secondary | ICD-10-CM

## 2013-09-12 DIAGNOSIS — R918 Other nonspecific abnormal finding of lung field: Secondary | ICD-10-CM

## 2013-09-12 DIAGNOSIS — C787 Secondary malignant neoplasm of liver and intrahepatic bile duct: Secondary | ICD-10-CM

## 2013-09-12 DIAGNOSIS — D702 Other drug-induced agranulocytosis: Secondary | ICD-10-CM

## 2013-09-12 DIAGNOSIS — Z5112 Encounter for antineoplastic immunotherapy: Secondary | ICD-10-CM

## 2013-09-12 LAB — CBC WITH DIFFERENTIAL/PLATELET
BASO%: 0.5 % (ref 0.0–2.0)
Basophils Absolute: 0 10*3/uL (ref 0.0–0.1)
EOS%: 1.9 % (ref 0.0–7.0)
Eosinophils Absolute: 0 10*3/uL (ref 0.0–0.5)
HEMATOCRIT: 38.1 % — AB (ref 38.4–49.9)
HGB: 13.4 g/dL (ref 13.0–17.1)
LYMPH#: 1.1 10*3/uL (ref 0.9–3.3)
LYMPH%: 45.2 % (ref 14.0–49.0)
MCH: 32.2 pg (ref 27.2–33.4)
MCHC: 35.3 g/dL (ref 32.0–36.0)
MCV: 91.3 fL (ref 79.3–98.0)
MONO#: 0.3 10*3/uL (ref 0.1–0.9)
MONO%: 12.2 % (ref 0.0–14.0)
NEUT#: 1 10*3/uL — ABNORMAL LOW (ref 1.5–6.5)
NEUT%: 40.2 % (ref 39.0–75.0)
Platelets: 143 10*3/uL (ref 140–400)
RBC: 4.17 10*6/uL — ABNORMAL LOW (ref 4.20–5.82)
RDW: 19.3 % — AB (ref 11.0–14.6)
WBC: 2.4 10*3/uL — ABNORMAL LOW (ref 4.0–10.3)

## 2013-09-12 LAB — COMPREHENSIVE METABOLIC PANEL
ALK PHOS: 67 U/L (ref 39–117)
ALT: 37 U/L (ref 0–53)
AST: 50 U/L — ABNORMAL HIGH (ref 0–37)
Albumin: 3.9 g/dL (ref 3.5–5.2)
BILIRUBIN TOTAL: 1.4 mg/dL — AB (ref 0.2–1.2)
BUN: 22 mg/dL (ref 6–23)
CO2: 20 mEq/L (ref 19–32)
Calcium: 9.3 mg/dL (ref 8.4–10.5)
Chloride: 98 mEq/L (ref 96–112)
Creatinine, Ser: 1.22 mg/dL (ref 0.50–1.35)
Glucose, Bld: 122 mg/dL — ABNORMAL HIGH (ref 70–99)
Potassium: 4 mEq/L (ref 3.5–5.3)
Sodium: 137 mEq/L (ref 135–145)
TOTAL PROTEIN: 7.1 g/dL (ref 6.0–8.3)

## 2013-09-12 LAB — UA PROTEIN, DIPSTICK - CHCC: Protein, ur: NEGATIVE mg/dL

## 2013-09-12 MED ORDER — SODIUM CHLORIDE 0.9 % IJ SOLN
10.0000 mL | INTRAMUSCULAR | Status: DC | PRN
  Administered 2013-09-12: 10 mL
  Filled 2013-09-12: qty 10

## 2013-09-12 MED ORDER — SODIUM CHLORIDE 0.9 % IV SOLN
7.5000 mg/kg | Freq: Once | INTRAVENOUS | Status: AC
  Administered 2013-09-12: 775 mg via INTRAVENOUS
  Filled 2013-09-12: qty 31

## 2013-09-12 MED ORDER — HEPARIN SOD (PORK) LOCK FLUSH 100 UNIT/ML IV SOLN
500.0000 [IU] | Freq: Once | INTRAVENOUS | Status: AC | PRN
  Administered 2013-09-12: 500 [IU]
  Filled 2013-09-12: qty 5

## 2013-09-12 MED ORDER — SODIUM CHLORIDE 0.9 % IV SOLN
Freq: Once | INTRAVENOUS | Status: AC
  Administered 2013-09-12: 17:00:00 via INTRAVENOUS

## 2013-09-12 NOTE — Progress Notes (Signed)
  [facility] OFFICE PROGRESS NOTE   Diagnosis:  Colon cancer.  INTERVAL HISTORY:   Christopher Fuentes returns as scheduled. He continues every three-week Avastin and Xeloda 2 weeks on/1 week off. He thinks he started the most recent cycle of Xeloda around 09/04/2013. He denies nausea/vomiting. No mouth sores. No diarrhea. No hand or foot pain or redness. He continues to note hyperpigmentation over the palms. He denies any bleeding. No shortness of breath or chest pain. No abdominal pain. He denies leg swelling and calf pain. He remains active. He has a good appetite.  Objective:  Vital signs in last 24 hours:  Blood pressure 117/71, pulse 73, temperature 98.3 F (36.8 C), temperature source Oral, resp. rate 18, height _0  (1.803 m), weight 218 lb (98.884 kg).    HEENT: No thrush or ulcerations. Resp: Lungs clear. Cardio: Regular cardiac rhythm. GI: Abdomen soft and nontender. No hepatomegaly. Vascular: No leg edema. Skin: Palms with hyperpigmentation. No erythema or skin breakdown.  Port-A-Cath site without erythema.    Lab Results:  Lab Results  Component Value Date   WBC 2.4* 09/12/2013   HGB 13.4 saline replacement for lipemia 09/12/2013   HCT 38.1* 09/12/2013   MCV 91.3 09/12/2013   PLT 143 09/12/2013   NEUTROABS 1.0* 09/12/2013    Imaging:  No results found.  Medications: I have reviewed the patient's current medications.  Assessment/Plan: 1. Metastatic colon cancer diagnosed in December 2013, status post a partial colectomy for a T3 N0 tumor, K-ras wild-type by standard testing.  CTs of the chest, abdomen, and pelvis February 2014 confirmed multiple bilateral lung masses, a liver lesion, and small mesenteric lymph nodes-not pathologically enlarged.  Segment 5 right liver biopsy February 2014 confirmed metastatic adenocarcinoma consistent with colon origin.  FOLFOX 6+ Avastin February 2014 through 12/05/2012.  Staging PET scan 03/01/2013 with 2 foci of  hypermetabolism in the liver, numerous bilateral pulmonary nodules-subcentimeter in size, none of the pulmonary nodules were hypermetabolic but were highly suspicious for metastases.  Initiation of Xeloda/Avastin on a 3 week schedule beginning 03/17/2013.  Restaging CT chest/abdomen/pelvis 08/14/2013. Index lesion right upper lobe 8 mm previously 7 mm; index left upper lobe lesion 10 mm previously 8 mm; some additional nodules in both lungs showed general stability with additional nodules in the lower lobes not definitely visualized on the prior exam which was limited by atelectasis. Foci of hypermetabolism within liver on prior PET CT not identified on current CT exam. 2. Neutropenia secondary to chemotherapy 07/06/2013. Improved 07/11/2013. Xeloda dose reduced to 1500 mg twice daily.    Disposition: Christopher Fuentes appears stable. Plan to proceed with Avastin today as planned. He has progressive neutropenia. We placed the Xeloda on hold. He will return for a followup CBC in one week. We scheduled a followup visit in 3 weeks. He will contact the office in the interim with any problems.  Plan reviewed with Christopher Fuentes.    Christopher Fuentes ANP/GNP-BC   09/12/2013  4:27 PM

## 2013-09-12 NOTE — Telephone Encounter (Signed)
, °

## 2013-09-12 NOTE — Patient Instructions (Signed)
[facility] Discharge Instructions for Patients Receiving Chemotherapy  Today you received the following chemotherapy agents  avastin  To help prevent nausea and vomiting after your treatment, we encourage you to take your nausea medication.   If you develop nausea and vomiting that is not controlled by your nausea medication, call the clinic.   BELOW ARE SYMPTOMS THAT SHOULD BE REPORTED IMMEDIATELY:  *FEVER GREATER THAN 100.5 F  *CHILLS WITH OR WITHOUT FEVER  NAUSEA AND VOMITING THAT IS NOT CONTROLLED WITH YOUR NAUSEA MEDICATION  *UNUSUAL SHORTNESS OF BREATH  *UNUSUAL BRUISING OR BLEEDING  TENDERNESS IN MOUTH AND THROAT WITH OR WITHOUT PRESENCE OF ULCERS  *URINARY PROBLEMS  *BOWEL PROBLEMS  UNUSUAL RASH Items with * indicate a potential emergency and should be followed up as soon as possible.  Feel free to call the clinic you have any questions or concerns. The clinic phone number is [phone].    

## 2013-09-12 NOTE — Telephone Encounter (Signed)
Per POF staff phone call scheduled appts. Advised schedulers 

## 2013-09-13 LAB — CEA: CEA: 38.9 ng/mL — ABNORMAL HIGH (ref 0.0–5.0)

## 2013-09-19 ENCOUNTER — Other Ambulatory Visit (HOSPITAL_BASED_OUTPATIENT_CLINIC_OR_DEPARTMENT_OTHER): Payer: 59

## 2013-09-19 DIAGNOSIS — C189 Malignant neoplasm of colon, unspecified: Secondary | ICD-10-CM

## 2013-09-19 DIAGNOSIS — C787 Secondary malignant neoplasm of liver and intrahepatic bile duct: Secondary | ICD-10-CM

## 2013-09-19 DIAGNOSIS — C187 Malignant neoplasm of sigmoid colon: Secondary | ICD-10-CM

## 2013-09-19 LAB — CBC WITH DIFFERENTIAL/PLATELET
BASO%: 1 % (ref 0.0–2.0)
Basophils Absolute: 0 10*3/uL (ref 0.0–0.1)
EOS%: 3.6 % (ref 0.0–7.0)
Eosinophils Absolute: 0.1 10*3/uL (ref 0.0–0.5)
HEMATOCRIT: 38.8 % (ref 38.4–49.9)
HGB: 13.2 g/dL (ref 13.0–17.1)
LYMPH%: 43.9 % (ref 14.0–49.0)
MCH: 31.1 pg (ref 27.2–33.4)
MCHC: 34 g/dL (ref 32.0–36.0)
MCV: 91.5 fL (ref 79.3–98.0)
MONO#: 0.8 10*3/uL (ref 0.1–0.9)
MONO%: 21.3 % — AB (ref 0.0–14.0)
NEUT%: 30.2 % — AB (ref 39.0–75.0)
NEUTROS ABS: 1.1 10*3/uL — AB (ref 1.5–6.5)
PLATELETS: 160 10*3/uL (ref 140–400)
RBC: 4.24 10*6/uL (ref 4.20–5.82)
RDW: 20.3 % — ABNORMAL HIGH (ref 11.0–14.6)
WBC: 3.5 10*3/uL — ABNORMAL LOW (ref 4.0–10.3)
lymph#: 1.6 10*3/uL (ref 0.9–3.3)

## 2013-09-20 ENCOUNTER — Telehealth: Payer: Self-pay | Admitting: *Deleted

## 2013-09-20 ENCOUNTER — Other Ambulatory Visit: Payer: Self-pay | Admitting: *Deleted

## 2013-09-20 DIAGNOSIS — C189 Malignant neoplasm of colon, unspecified: Secondary | ICD-10-CM

## 2013-09-20 MED ORDER — CAPECITABINE 500 MG PO TABS: 1500.0000 mg | ORAL_TABLET | Freq: Two times a day (BID) | ORAL | Status: DC

## 2013-09-20 NOTE — Telephone Encounter (Signed)
Message from pt requesting lab results from 8/4 and last week. Returned call, left message instructing pt to continue to [place]. WBC remain low.

## 2013-09-21 NOTE — Telephone Encounter (Signed)
Left message on voicemail for pt to call office. Dr. Benay Spice has reviewed labs. OK to resume Xeloda. CEA is a little higher. Will repeat with next lab, if higher again will repeat scan.

## 2013-09-25 ENCOUNTER — Telehealth: Payer: Self-pay | Admitting: *Deleted

## 2013-09-25 DIAGNOSIS — C189 Malignant neoplasm of colon, unspecified: Secondary | ICD-10-CM

## 2013-09-25 NOTE — Telephone Encounter (Signed)
Notified patient that CEA is higher per MD. Wants him to resume Xeloda and will recheck CEA at next lab visit. If higher again, then will rescan. Patient understands and agrees. He resumed his Xeloda on Saturday.

## 2013-10-01 ENCOUNTER — Other Ambulatory Visit: Payer: Self-pay | Admitting: Oncology

## 2013-10-02 ENCOUNTER — Telehealth: Payer: Self-pay | Admitting: *Deleted

## 2013-10-02 NOTE — Telephone Encounter (Signed)
Per order from Dr. Benay Spice, extended KRAS testing request was made to the hospital pathology department in [facility], [place]. to be performed on Accession [id number] from their hospital.  Spoke with Virgilio Belling in pathology department at [phone].  She stated they would submit the request to [facility] after receipt of faxed request.  This RN Ffxed and confirmed receipt of the request.

## 2013-10-03 ENCOUNTER — Telehealth: Payer: Self-pay | Admitting: *Deleted

## 2013-10-03 ENCOUNTER — Other Ambulatory Visit (HOSPITAL_BASED_OUTPATIENT_CLINIC_OR_DEPARTMENT_OTHER): Payer: 59

## 2013-10-03 ENCOUNTER — Telehealth: Payer: Self-pay | Admitting: Oncology

## 2013-10-03 ENCOUNTER — Ambulatory Visit (HOSPITAL_BASED_OUTPATIENT_CLINIC_OR_DEPARTMENT_OTHER): Payer: 59 | Admitting: Oncology

## 2013-10-03 ENCOUNTER — Ambulatory Visit (HOSPITAL_BASED_OUTPATIENT_CLINIC_OR_DEPARTMENT_OTHER): Payer: 59

## 2013-10-03 VITALS — BP 126/81 | HR 64

## 2013-10-03 VITALS — BP 127/89 | HR 74 | Temp 98.3°F | Resp 18 | Ht 71.0 in | Wt 213.7 lb

## 2013-10-03 DIAGNOSIS — C787 Secondary malignant neoplasm of liver and intrahepatic bile duct: Secondary | ICD-10-CM

## 2013-10-03 DIAGNOSIS — C189 Malignant neoplasm of colon, unspecified: Secondary | ICD-10-CM

## 2013-10-03 DIAGNOSIS — Z5112 Encounter for antineoplastic immunotherapy: Secondary | ICD-10-CM

## 2013-10-03 DIAGNOSIS — C187 Malignant neoplasm of sigmoid colon: Secondary | ICD-10-CM

## 2013-10-03 DIAGNOSIS — R911 Solitary pulmonary nodule: Secondary | ICD-10-CM

## 2013-10-03 LAB — CBC WITH DIFFERENTIAL/PLATELET
BASO%: 0.4 % (ref 0.0–2.0)
Basophils Absolute: 0 10*3/uL (ref 0.0–0.1)
EOS%: 5.3 % (ref 0.0–7.0)
Eosinophils Absolute: 0.2 10*3/uL (ref 0.0–0.5)
HCT: 41.6 % (ref 38.4–49.9)
HGB: 13.6 g/dL (ref 13.0–17.1)
LYMPH%: 37.5 % (ref 14.0–49.0)
MCH: 30.6 pg (ref 27.2–33.4)
MCHC: 32.7 g/dL (ref 32.0–36.0)
MCV: 93.7 fL (ref 79.3–98.0)
MONO#: 0.8 10*3/uL (ref 0.1–0.9)
MONO%: 18 % — AB (ref 0.0–14.0)
NEUT#: 1.7 10*3/uL (ref 1.5–6.5)
NEUT%: 38.8 % — AB (ref 39.0–75.0)
Platelets: 182 10*3/uL (ref 140–400)
RBC: 4.44 10*6/uL (ref 4.20–5.82)
RDW: 20.9 % — ABNORMAL HIGH (ref 11.0–14.6)
WBC: 4.3 10*3/uL (ref 4.0–10.3)
lymph#: 1.6 10*3/uL (ref 0.9–3.3)

## 2013-10-03 LAB — COMPREHENSIVE METABOLIC PANEL (CC13)
ALK PHOS: 89 U/L (ref 40–150)
ALT: 36 U/L (ref 0–55)
AST: 44 U/L — ABNORMAL HIGH (ref 5–34)
Albumin: 3.7 g/dL (ref 3.5–5.0)
Anion Gap: 12 mEq/L — ABNORMAL HIGH (ref 3–11)
BUN: 25 mg/dL (ref 7.0–26.0)
CO2: 22 mEq/L (ref 22–29)
Calcium: 9.8 mg/dL (ref 8.4–10.4)
Chloride: 103 mEq/L (ref 98–109)
Creatinine: 1.5 mg/dL — ABNORMAL HIGH (ref 0.7–1.3)
GLUCOSE: 107 mg/dL (ref 70–140)
Potassium: 3.8 mEq/L (ref 3.5–5.1)
SODIUM: 137 meq/L (ref 136–145)
Total Bilirubin: 1.61 mg/dL — ABNORMAL HIGH (ref 0.20–1.20)
Total Protein: 7.6 g/dL (ref 6.4–8.3)

## 2013-10-03 LAB — UA PROTEIN, DIPSTICK - CHCC: PROTEIN: NEGATIVE mg/dL

## 2013-10-03 LAB — CEA: CEA: 53.5 ng/mL — ABNORMAL HIGH (ref 0.0–5.0)

## 2013-10-03 MED ORDER — SODIUM CHLORIDE 0.9 % IV SOLN
7.5000 mg/kg | Freq: Once | INTRAVENOUS | Status: AC
  Administered 2013-10-03: 775 mg via INTRAVENOUS
  Filled 2013-10-03: qty 31

## 2013-10-03 MED ORDER — SODIUM CHLORIDE 0.9 % IJ SOLN
10.0000 mL | INTRAMUSCULAR | Status: DC | PRN
  Administered 2013-10-03: 10 mL
  Filled 2013-10-03: qty 10

## 2013-10-03 MED ORDER — HEPARIN SOD (PORK) LOCK FLUSH 100 UNIT/ML IV SOLN
500.0000 [IU] | Freq: Once | INTRAVENOUS | Status: AC | PRN
  Administered 2013-10-03: 500 [IU]
  Filled 2013-10-03: qty 5

## 2013-10-03 MED ORDER — SODIUM CHLORIDE 0.9 % IV SOLN
Freq: Once | INTRAVENOUS | Status: AC
  Administered 2013-10-03: 10:00:00 via INTRAVENOUS

## 2013-10-03 NOTE — Telephone Encounter (Signed)
Received call from Devonne Doughty from Pathology Department at [facility]. Med. Ctr. In [place], [place].  She stated she would send the blocks/slides to [facility] pathology department and that they could then send the slides/blocks out for extended KRAS testing.  Spoke with J. Epps in pathology who is contacting Neoma Laming for further details to process this request for testing.

## 2013-10-03 NOTE — Telephone Encounter (Signed)
Dr. Benay Spice  agreed to and signed written note to use  [employer]. A written fax request was sent  to Attn: Devonne Doughty, fax # [phone] [facility] Pathology Department at [facility]. Med. Ctr. In [place], [place].  Requesting BRAF, KRAS, And NRAS testing on Accession # [id number].  Confirmed with Neoma Laming that this is all that is needed to perform requested tests per MD.

## 2013-10-03 NOTE — Patient Instructions (Signed)
[facility] Discharge Instructions for Patients Receiving Chemotherapy  Today you received the following chemotherapy agents Avastin.  To help prevent nausea and vomiting after your treatment, we encourage you to take your nausea medication.   If you develop nausea and vomiting that is not controlled by your nausea medication, call the clinic.   BELOW ARE SYMPTOMS THAT SHOULD BE REPORTED IMMEDIATELY:  *FEVER GREATER THAN 100.5 F  *CHILLS WITH OR WITHOUT FEVER  NAUSEA AND VOMITING THAT IS NOT CONTROLLED WITH YOUR NAUSEA MEDICATION  *UNUSUAL SHORTNESS OF BREATH  *UNUSUAL BRUISING OR BLEEDING  TENDERNESS IN MOUTH AND THROAT WITH OR WITHOUT PRESENCE OF ULCERS  *URINARY PROBLEMS  *BOWEL PROBLEMS  UNUSUAL RASH Items with * indicate a potential emergency and should be followed up as soon as possible.  Feel free to call the clinic you have any questions or concerns. The clinic phone number is [phone].    

## 2013-10-03 NOTE — Progress Notes (Signed)
Okay to treat today despite labs, per Dr. Benay Spice. Pre and post Avastin vital signs stable.

## 2013-10-03 NOTE — Telephone Encounter (Signed)
Message from pharmacy, they have been unable to contact pt to arrange for delivery of Xeloda (Rx written 8/5). Spoke with pt, he will contact pharmacy.

## 2013-10-03 NOTE — Progress Notes (Signed)
  Christopher Fuentes OFFICE PROGRESS NOTE   Diagnosis: Colon cancer  INTERVAL HISTORY:   He returns as scheduled. He resumed Xeloda 09/23/2013 and finished on 09/30/2013. No mouth sores or hand/foot pain. No symptoms of thrombosis. No bleeding. Stable hyperpigmentation and skin thickening at the hands and feet. He had diarrhea after eating [place] food. Good appetite. He relates weight loss to working long hours.  Objective:  Vital signs in last 24 hours:  Blood pressure 127/89, pulse 74, temperature 98.3 F (36.8 C), temperature source Oral, resp. rate 18, height $RemoveBe'5\' 11"'GVjAAdDSB$  (1.803 m), weight 213 lb 11.2 oz (96.934 kg).    HEENT: No thrush or ulcers, hyperpigmentation Resp: Lungs clear bilaterally Cardio: Regular rate and rhythm GI: No hepatomegaly, nontender Vascular: No leg edema  Skin: Hyperpigmentation and skin thickening over the hands and feet. Superficial dry desquamation at the dorsum of the left foot   Portacath/PICC-without erythema  Lab Results:  Lab Results  Component Value Date   WBC 4.3 10/03/2013   HGB 13.6 10/03/2013   HCT 41.6 10/03/2013   MCV 93.7 10/03/2013   PLT 182 10/03/2013   NEUTROABS 1.7 10/03/2013    Urine protein-negative  Lab Results  Component Value Date   CEA 38.9* 09/12/2013    Medications: I have reviewed the patient's current medications.  Assessment/Plan: 1. Metastatic colon cancer diagnosed in December 2013, status post a partial colectomy for a T3 N0 tumor, K-ras wild-type by standard testing.  CTs of the chest, abdomen, and pelvis February 2014 confirmed multiple bilateral lung masses, a liver lesion, and small mesenteric lymph nodes-not pathologically enlarged.  Segment 5 right liver biopsy February 2014 confirmed metastatic adenocarcinoma consistent with colon origin.  FOLFOX 6+ Avastin February 2014 through 12/05/2012.  Staging PET scan 03/01/2013 with 2 foci of hypermetabolism in the liver, numerous bilateral pulmonary  nodules-subcentimeter in size, none of the pulmonary nodules were hypermetabolic but were highly suspicious for metastases.  Initiation of Xeloda/Avastin on a 3 week schedule beginning 03/17/2013.  Restaging CT chest/abdomen/pelvis 08/14/2013. Index lesion right upper lobe 8 mm previously 7 mm; index left upper lobe lesion 10 mm previously 8 mm; some additional nodules in both lungs showed general stability with additional nodules in the lower lobes not definitely visualized on the prior exam which was limited by atelectasis. Foci of hypermetabolism within liver on prior PET CT not identified on current CT exam. 2. Neutropenia secondary to chemotherapy 07/06/2013. Improved 07/11/2013. Xeloda dose reduced to 1500 mg twice daily.    Disposition:  His overall status appears unchanged. He will continue every three-week Avastin. He will begin another cycle of Xeloda on 10/07/2013. The CEA was higher last month. We will followup on the CEA from today and schedule restaging CT scans if the CEA is higher.  [name] will return for an office visit 10/24/2013.  Betsy Coder, MD  10/03/2013  8:31 AM

## 2013-10-03 NOTE — Telephone Encounter (Signed)
gv and printed appt sched and avs for opt for Sept...sed added tx...pt aware per pof to be here at 8am

## 2013-10-03 NOTE — Telephone Encounter (Signed)
Received call from Devonne Doughty from Pathology Department at [facility]. Med. Ctr. In [place], [place]. She stated that,  per J. Epps in [facility] Pathology, [facility] will not send the tissue blocks out.  She called to ask of Dr. Benay Spice would be more specific to exactly what type of Extended KRAS testing he wants done and to ask if he would agree to use the vendor they use which is [employer].  A written fax request will be needed and should go to Attn: Devonne Doughty, fax # [phone].  Dr. Gearldine Shown nurse notified of above.

## 2013-10-04 ENCOUNTER — Other Ambulatory Visit: Payer: Self-pay | Admitting: *Deleted

## 2013-10-04 DIAGNOSIS — C189 Malignant neoplasm of colon, unspecified: Secondary | ICD-10-CM

## 2013-10-05 ENCOUNTER — Telehealth: Payer: Self-pay | Admitting: *Deleted

## 2013-10-05 NOTE — Telephone Encounter (Signed)
Message copied by Domenic Schwab on Thu Oct 05, 2013  9:11 AM ------      Message from: Betsy Coder B      Created: Tue Oct 03, 2013  9:55 PM       CEA is higher, schedule CT chest,abd., pelvis with contrast, few days prior to next visit ------

## 2013-10-05 NOTE — Telephone Encounter (Signed)
Per Dr. Benay Spice; notified pt that CT has been scheduled d/t CEA higher.  Pt verbalized understanding and confirmed appt for CT 10/10/13 @ 8:30.

## 2013-10-09 ENCOUNTER — Telehealth: Payer: Self-pay | Admitting: *Deleted

## 2013-10-09 NOTE — Telephone Encounter (Signed)
Pt called requesting to pick up oral contrast after 6PM today. Returned call, instructed him to pick up contrast from [facility] radiology. Staff will likely not be at the [facility] after 6.

## 2013-10-10 ENCOUNTER — Encounter (HOSPITAL_COMMUNITY): Payer: Self-pay

## 2013-10-10 ENCOUNTER — Ambulatory Visit (HOSPITAL_COMMUNITY)
Admission: RE | Admit: 2013-10-10 | Discharge: 2013-10-10 | Disposition: A | Discharge status: Home / Self Care | Payer: 59 | Source: Ambulatory Visit | Attending: Oncology | Admitting: Oncology

## 2013-10-10 DIAGNOSIS — C7800 Secondary malignant neoplasm of unspecified lung: Secondary | ICD-10-CM | POA: Diagnosis not present

## 2013-10-10 DIAGNOSIS — C787 Secondary malignant neoplasm of liver and intrahepatic bile duct: Secondary | ICD-10-CM | POA: Diagnosis not present

## 2013-10-10 DIAGNOSIS — C189 Malignant neoplasm of colon, unspecified: Secondary | ICD-10-CM

## 2013-10-10 MED ORDER — IOHEXOL 300 MG/ML  SOLN
100.0000 mL | Freq: Once | INTRAMUSCULAR | Status: AC | PRN
Start: 2013-10-10 — End: 2013-10-10
  Administered 2013-10-10: 100 mL via INTRAVENOUS

## 2013-10-14 ENCOUNTER — Other Ambulatory Visit: Payer: Self-pay | Admitting: Oncology

## 2013-10-14 DIAGNOSIS — C189 Malignant neoplasm of colon, unspecified: Secondary | ICD-10-CM

## 2013-10-14 DIAGNOSIS — C787 Secondary malignant neoplasm of liver and intrahepatic bile duct: Secondary | ICD-10-CM

## 2013-10-14 DIAGNOSIS — I10 Essential (primary) hypertension: Secondary | ICD-10-CM

## 2013-10-17 ENCOUNTER — Telehealth: Payer: Self-pay | Admitting: *Deleted

## 2013-10-17 NOTE — Telephone Encounter (Signed)
Spoke with Devonne Doughty,  Pathology Department at [facility]. Med. Ctr. In [place], [place]. (phone [phone]) re: results of BRAF, KRAS. amd NRAS testing.  She confirmed the results were back.  She faxed over results which were given to Dr. Benay Spice.  She also stated that she would mail hard copies to Dr. Benay Spice.

## 2013-10-18 ENCOUNTER — Telehealth: Payer: Self-pay | Admitting: *Deleted

## 2013-10-18 NOTE — Telephone Encounter (Signed)
Per Dr. Benay Spice; notified pt that CT showed slightly bigger lesions in the liver and slightly bigger in the lung nodule but overall stable.  Pt verbalized understanding and confirmed appt for 10/24/13 to discuss next steps with MD.

## 2013-10-24 ENCOUNTER — Ambulatory Visit (HOSPITAL_BASED_OUTPATIENT_CLINIC_OR_DEPARTMENT_OTHER): Payer: 59 | Admitting: Oncology

## 2013-10-24 ENCOUNTER — Ambulatory Visit: Payer: 59

## 2013-10-24 ENCOUNTER — Other Ambulatory Visit: Payer: 59

## 2013-10-24 ENCOUNTER — Telehealth: Payer: Self-pay | Admitting: Oncology

## 2013-10-24 VITALS — BP 134/72 | HR 64 | Temp 98.1°F | Resp 24 | Ht 71.0 in | Wt 214.4 lb

## 2013-10-24 DIAGNOSIS — C787 Secondary malignant neoplasm of liver and intrahepatic bile duct: Secondary | ICD-10-CM

## 2013-10-24 DIAGNOSIS — C189 Malignant neoplasm of colon, unspecified: Secondary | ICD-10-CM

## 2013-10-24 DIAGNOSIS — C187 Malignant neoplasm of sigmoid colon: Secondary | ICD-10-CM

## 2013-10-24 DIAGNOSIS — C7800 Secondary malignant neoplasm of unspecified lung: Secondary | ICD-10-CM

## 2013-10-24 NOTE — Progress Notes (Signed)
[place] OFFICE PROGRESS NOTE   Diagnosis: Colon cancer  INTERVAL HISTORY:   He returns as scheduled. He completed another course of Xeloda/Avastin beginning 10/07/2013. No mouth sores or diarrhea. Stable skin changes over the hands and feet. This is not painful. No new complaint. He is working.  Objective:  Vital signs in last 24 hours:  Blood pressure 134/72, pulse 64, temperature 98.1 F (36.7 C), temperature source Oral, resp. rate 24, height 5' 11"  (1.803 m), weight 214 lb 6.4 oz (97.251 kg), SpO2 100.00%.    HEENT: No thrush or ulcers Lymphatics: No cervical, supraclavicular, or inguinal nodes. "Shotty "bilateral axillary nodes Resp: Lungs clear bilaterally Cardio: Regular rate and rhythm GI: No hepatomegaly, nontender Vascular: No leg edema  Skin: Hyperpigmentation and skin thickening over the hands. Mild hyperpigmentation and dryness at the soles   Portacath/PICC-without erythema  Lab Results:  Lab Results  Component Value Date   WBC 4.3 10/03/2013   HGB 13.6 10/03/2013   HCT 41.6 10/03/2013   MCV 93.7 10/03/2013   PLT 182 10/03/2013   NEUTROABS 1.7 10/03/2013     Lab Results  Component Value Date   CEA 53.5* 10/03/2013    Imaging: CTs of the chest, abdomen, and pelvis on 10/10/2013-multiple pulmonary metastases, some slightly smaller and others slightly increased. Progressive disease in the liver with a lesion previously noted on a PET 03/01/2013 now clearly identify measuring 2.7 x 2 cm. Another hypermetabolic lesion on the PET scan is not identified.   Medications: I have reviewed the patient's current medications.  Assessment/Plan: 1. Metastatic colon cancer diagnosed in December 2013, status post a partial colectomy for a T3 N0 tumor, K-ras wild-type by standard testing. No loss of mismatch repair protein expression, K-ras exon 4 mutation detected, NRAS negative,BRAF negative CTs of the chest, abdomen, and pelvis February 2014 confirmed  multiple bilateral lung masses, a liver lesion, and small mesenteric lymph nodes-not pathologically enlarged.  Segment 5 right liver biopsy February 2014 confirmed metastatic adenocarcinoma consistent with colon origin.  FOLFOX 6+ Avastin February 2014 through 12/05/2012.  Staging PET scan 03/01/2013 with 2 foci of hypermetabolism in the liver, numerous bilateral pulmonary nodules-subcentimeter in size, none of the pulmonary nodules were hypermetabolic but were highly suspicious for metastases.  Initiation of Xeloda/Avastin on a 3 week schedule beginning 03/17/2013.  Restaging CT chest/abdomen/pelvis 08/14/2013. Index lesion right upper lobe 8 mm previously 7 mm; index left upper lobe lesion 10 mm previously 8 mm; some additional nodules in both lungs showed general stability with additional nodules in the lower lobes not definitely visualized on the prior exam which was limited by atelectasis. Foci of hypermetabolism within liver on prior PET CT not identified on current CT exam. Restaging CT 10/11/2011 with progression of a liver lesion and a mixed response involving lung metastases 2. Neutropenia secondary to chemotherapy 07/06/2013. Improved 07/11/2013. Xeloda dose reduced to 1500 mg twice daily.  3. 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 Cast appears stable. The CEA is higher and the restaging CT indicates evidence of disease progression. I reviewed the CT images with him. A K-ras mutation was identified upon testing of the colon tumor from 2013. He will not be a candidate for panitumumab or cetuximab.  I recommend discontinuing Xeloda/Avastin. We decided to proceed with FOLFIRI/Avastin.  I reviewed the potential toxicities associated with the FOLFIRI regimen including the chance for nausea/vomiting, mucositis, diarrhea, alopecia, and hematologic toxicity. We discussed the hand/foot syndrome associated with infusional 5-fluorouracil and  the diarrhea seen with  irinotecan. He agrees to proceed.  The plan is to begin a first cycle of FOLFIRI/Avastin on 11/02/2013.  Betsy Coder, MD  10/24/2013  8:43 AM

## 2013-10-24 NOTE — Telephone Encounter (Signed)
gv and printed appt sched and avs for pt for Sept and OCT....sed added tx °

## 2013-10-27 ENCOUNTER — Telehealth: Payer: Self-pay | Admitting: *Deleted

## 2013-10-27 MED ORDER — LOPERAMIDE HCL 2 MG PO CAPS: ORAL_CAPSULE | ORAL | Status: AC

## 2013-10-27 NOTE — Telephone Encounter (Signed)
Left message on voicemail for pt to pick up Imodium prior to next chemo tx.

## 2013-10-29 ENCOUNTER — Other Ambulatory Visit: Payer: Self-pay | Admitting: Oncology

## 2013-10-31 ENCOUNTER — Telehealth: Payer: Self-pay | Admitting: Oncology

## 2013-10-31 NOTE — Telephone Encounter (Signed)
s.w. pt and advised on on new d.t..Marland KitchenMarland KitchenI put in wrong time for tx...pt ok and aware

## 2013-11-02 ENCOUNTER — Telehealth: Payer: Self-pay | Admitting: Oncology

## 2013-11-02 ENCOUNTER — Telehealth: Payer: Self-pay | Admitting: *Deleted

## 2013-11-02 ENCOUNTER — Other Ambulatory Visit: Payer: Self-pay | Admitting: *Deleted

## 2013-11-02 ENCOUNTER — Ambulatory Visit: Payer: 59

## 2013-11-02 ENCOUNTER — Other Ambulatory Visit (HOSPITAL_BASED_OUTPATIENT_CLINIC_OR_DEPARTMENT_OTHER): Payer: 59

## 2013-11-02 DIAGNOSIS — C187 Malignant neoplasm of sigmoid colon: Secondary | ICD-10-CM

## 2013-11-02 DIAGNOSIS — C787 Secondary malignant neoplasm of liver and intrahepatic bile duct: Secondary | ICD-10-CM

## 2013-11-02 DIAGNOSIS — C189 Malignant neoplasm of colon, unspecified: Secondary | ICD-10-CM

## 2013-11-02 LAB — COMPREHENSIVE METABOLIC PANEL (CC13)
ALK PHOS: 94 U/L (ref 40–150)
ALT: 29 U/L (ref 0–55)
AST: 39 U/L — AB (ref 5–34)
Albumin: 3.7 g/dL (ref 3.5–5.0)
Anion Gap: 11 mEq/L (ref 3–11)
BUN: 19.5 mg/dL (ref 7.0–26.0)
CO2: 21 mEq/L — ABNORMAL LOW (ref 22–29)
CREATININE: 1.4 mg/dL — AB (ref 0.7–1.3)
Calcium: 9.3 mg/dL (ref 8.4–10.4)
Chloride: 108 mEq/L (ref 98–109)
GLUCOSE: 102 mg/dL (ref 70–140)
Potassium: 3.9 mEq/L (ref 3.5–5.1)
Sodium: 140 mEq/L (ref 136–145)
Total Bilirubin: 0.95 mg/dL (ref 0.20–1.20)
Total Protein: 7.6 g/dL (ref 6.4–8.3)

## 2013-11-02 LAB — UA PROTEIN, DIPSTICK - CHCC: PROTEIN: NEGATIVE mg/dL

## 2013-11-02 LAB — CBC WITH DIFFERENTIAL/PLATELET
BASO%: 1 % (ref 0.0–2.0)
Basophils Absolute: 0 10*3/uL (ref 0.0–0.1)
EOS%: 9.8 % — AB (ref 0.0–7.0)
Eosinophils Absolute: 0.3 10*3/uL (ref 0.0–0.5)
HCT: 40.5 % (ref 38.4–49.9)
HGB: 13 g/dL (ref 13.0–17.1)
LYMPH%: 39.8 % (ref 14.0–49.0)
MCH: 31.5 pg (ref 27.2–33.4)
MCHC: 32 g/dL (ref 32.0–36.0)
MCV: 98.5 fL — ABNORMAL HIGH (ref 79.3–98.0)
MONO#: 0.6 10*3/uL (ref 0.1–0.9)
MONO%: 19.3 % — AB (ref 0.0–14.0)
NEUT%: 30.1 % — ABNORMAL LOW (ref 39.0–75.0)
NEUTROS ABS: 0.9 10*3/uL — AB (ref 1.5–6.5)
Platelets: 145 10*3/uL (ref 140–400)
RBC: 4.11 10*6/uL — ABNORMAL LOW (ref 4.20–5.82)
RDW: 19.5 % — AB (ref 11.0–14.6)
WBC: 2.9 10*3/uL — ABNORMAL LOW (ref 4.0–10.3)
lymph#: 1.2 10*3/uL (ref 0.9–3.3)

## 2013-11-02 LAB — CEA: CEA: 74.2 ng/mL — ABNORMAL HIGH (ref 0.0–5.0)

## 2013-11-02 NOTE — Telephone Encounter (Signed)
gv and printed appt sched and avs for pt for Sept and OCT....sed added tx °

## 2013-11-02 NOTE — Telephone Encounter (Signed)
[place]ake 

## 2013-11-08 ENCOUNTER — Telehealth: Payer: Self-pay | Admitting: *Deleted

## 2013-11-08 NOTE — Telephone Encounter (Signed)
Per staff message and POF I have scheduled appts. Advised scheduler of appts. JMW  

## 2013-11-09 ENCOUNTER — Other Ambulatory Visit (HOSPITAL_BASED_OUTPATIENT_CLINIC_OR_DEPARTMENT_OTHER): Payer: 59

## 2013-11-09 ENCOUNTER — Telehealth: Payer: Self-pay | Admitting: *Deleted

## 2013-11-09 ENCOUNTER — Other Ambulatory Visit: Payer: Self-pay | Admitting: Oncology

## 2013-11-09 ENCOUNTER — Ambulatory Visit (HOSPITAL_BASED_OUTPATIENT_CLINIC_OR_DEPARTMENT_OTHER): Payer: 59

## 2013-11-09 VITALS — BP 139/89 | HR 48 | Temp 98.6°F | Resp 18

## 2013-11-09 DIAGNOSIS — C187 Malignant neoplasm of sigmoid colon: Secondary | ICD-10-CM

## 2013-11-09 DIAGNOSIS — Z5111 Encounter for antineoplastic chemotherapy: Secondary | ICD-10-CM

## 2013-11-09 DIAGNOSIS — Z5112 Encounter for antineoplastic immunotherapy: Secondary | ICD-10-CM

## 2013-11-09 DIAGNOSIS — C787 Secondary malignant neoplasm of liver and intrahepatic bile duct: Secondary | ICD-10-CM

## 2013-11-09 DIAGNOSIS — C189 Malignant neoplasm of colon, unspecified: Secondary | ICD-10-CM

## 2013-11-09 LAB — CBC WITH DIFFERENTIAL/PLATELET
BASO%: 1.3 % (ref 0.0–2.0)
Basophils Absolute: 0 10*3/uL (ref 0.0–0.1)
EOS%: 6.9 % (ref 0.0–7.0)
Eosinophils Absolute: 0.2 10*3/uL (ref 0.0–0.5)
HEMATOCRIT: 39.7 % (ref 38.4–49.9)
HGB: 13 g/dL (ref 13.0–17.1)
LYMPH#: 1.4 10*3/uL (ref 0.9–3.3)
LYMPH%: 44.4 % (ref 14.0–49.0)
MCH: 32 pg (ref 27.2–33.4)
MCHC: 32.8 g/dL (ref 32.0–36.0)
MCV: 97.7 fL (ref 79.3–98.0)
MONO#: 0.6 10*3/uL (ref 0.1–0.9)
MONO%: 18.7 % — ABNORMAL HIGH (ref 0.0–14.0)
NEUT#: 0.9 10*3/uL — ABNORMAL LOW (ref 1.5–6.5)
NEUT%: 28.7 % — AB (ref 39.0–75.0)
PLATELETS: 160 10*3/uL (ref 140–400)
RBC: 4.07 10*6/uL — AB (ref 4.20–5.82)
RDW: 18.2 % — ABNORMAL HIGH (ref 11.0–14.6)
WBC: 3 10*3/uL — AB (ref 4.0–10.3)

## 2013-11-09 LAB — COMPREHENSIVE METABOLIC PANEL (CC13)
ALT: 31 U/L (ref 0–55)
AST: 42 U/L — ABNORMAL HIGH (ref 5–34)
Albumin: 3.4 g/dL — ABNORMAL LOW (ref 3.5–5.0)
Alkaline Phosphatase: 87 U/L (ref 40–150)
Anion Gap: 12 mEq/L — ABNORMAL HIGH (ref 3–11)
BUN: 18.6 mg/dL (ref 7.0–26.0)
CALCIUM: 9.3 mg/dL (ref 8.4–10.4)
CO2: 21 meq/L — AB (ref 22–29)
Chloride: 106 mEq/L (ref 98–109)
Creatinine: 1.3 mg/dL (ref 0.7–1.3)
GLUCOSE: 120 mg/dL (ref 70–140)
Potassium: 4 mEq/L (ref 3.5–5.1)
SODIUM: 139 meq/L (ref 136–145)
TOTAL PROTEIN: 7.6 g/dL (ref 6.4–8.3)
Total Bilirubin: 0.84 mg/dL (ref 0.20–1.20)

## 2013-11-09 LAB — UA PROTEIN, DIPSTICK - CHCC: Protein, ur: NEGATIVE mg/dL

## 2013-11-09 LAB — CEA: CEA: 72.2 ng/mL — AB (ref 0.0–5.0)

## 2013-11-09 MED ORDER — ATROPINE SULFATE 1 MG/ML IJ SOLN
0.5000 mg | Freq: Once | INTRAMUSCULAR | Status: AC | PRN
  Administered 2013-11-09: 0.5 mg via INTRAVENOUS

## 2013-11-09 MED ORDER — DEXAMETHASONE SODIUM PHOSPHATE 20 MG/5ML IJ SOLN
INTRAMUSCULAR | Status: AC
  Filled 2013-11-09: qty 5

## 2013-11-09 MED ORDER — BEVACIZUMAB CHEMO INJECTION 400 MG/16ML
500.0000 mg | Freq: Once | INTRAVENOUS | Status: AC
  Administered 2013-11-09: 500 mg via INTRAVENOUS
  Filled 2013-11-09: qty 20

## 2013-11-09 MED ORDER — SODIUM CHLORIDE 0.9 % IV SOLN
Freq: Once | INTRAVENOUS | Status: AC
  Administered 2013-11-09: 09:00:00 via INTRAVENOUS

## 2013-11-09 MED ORDER — ONDANSETRON 16 MG/50ML IVPB (CHCC)
INTRAVENOUS | Status: AC
  Filled 2013-11-09: qty 16

## 2013-11-09 MED ORDER — LEUCOVORIN CALCIUM INJECTION 350 MG
398.0000 mg/m2 | Freq: Once | INTRAMUSCULAR | Status: AC
  Administered 2013-11-09: 880 mg via INTRAVENOUS
  Filled 2013-11-09: qty 44

## 2013-11-09 MED ORDER — PROCHLORPERAZINE MALEATE 10 MG PO TABS
10.0000 mg | ORAL_TABLET | Freq: Four times a day (QID) | ORAL | Status: AC | PRN
Start: 2013-11-09 — End: ?

## 2013-11-09 MED ORDER — DEXAMETHASONE SODIUM PHOSPHATE 20 MG/5ML IJ SOLN
20.0000 mg | Freq: Once | INTRAMUSCULAR | Status: AC
  Administered 2013-11-09: 20 mg via INTRAVENOUS

## 2013-11-09 MED ORDER — ONDANSETRON HCL 8 MG PO TABS: 8.0000 mg | ORAL_TABLET | Freq: Two times a day (BID) | ORAL | Status: AC

## 2013-11-09 MED ORDER — FLUOROURACIL CHEMO INJECTION 2.5 GM/50ML
400.0000 mg/m2 | Freq: Once | INTRAVENOUS | Status: AC
  Administered 2013-11-09: 900 mg via INTRAVENOUS
  Filled 2013-11-09: qty 18

## 2013-11-09 MED ORDER — SODIUM CHLORIDE 0.9 % IV SOLN
2400.0000 mg/m2 | INTRAVENOUS | Status: DC
  Administered 2013-11-09: 5300 mg via INTRAVENOUS
  Filled 2013-11-09: qty 106

## 2013-11-09 MED ORDER — DEXTROSE 5 % IV SOLN
125.0000 mg/m2 | Freq: Once | INTRAVENOUS | Status: AC
  Administered 2013-11-09: 276 mg via INTRAVENOUS
  Filled 2013-11-09: qty 13.8

## 2013-11-09 MED ORDER — ONDANSETRON 16 MG/50ML IVPB (CHCC)
16.0000 mg | Freq: Once | INTRAVENOUS | Status: AC
  Administered 2013-11-09: 16 mg via INTRAVENOUS

## 2013-11-09 MED ORDER — ATROPINE SULFATE 1 MG/ML IJ SOLN
INTRAMUSCULAR | Status: AC
  Filled 2013-11-09: qty 1

## 2013-11-09 NOTE — Patient Instructions (Addendum)
[facility] Discharge Instructions for Patients Receiving Chemotherapy  Today you received the following chemotherapy agents Avastin, Irinotecan, 5FU and Leucovorin.   To help prevent nausea and vomiting after your treatment, we encourage you to take your nausea medication Compazine 10mg  every 6 hours as needed for nausea.  Irinotecan injection What is this medicine? IRINOTECAN (ir in oh TEE kan ) is a chemotherapy drug. It is used to treat colon and rectal cancer. This medicine may be used for other purposes; ask your health care provider or pharmacist if you have questions. COMMON BRAND NAME(S): Camptosar What should I tell my health care provider before I take this medicine? They need to know if you have any of these conditions: -blood disorders -dehydration -diarrhea -infection (especially a virus infection such as chickenpox, cold sores, or herpes) -liver disease -low blood counts, like low white cell, platelet, or red cell counts -recent or ongoing radiation therapy -an unusual or allergic reaction to irinotecan, sorbitol, other chemotherapy, other medicines, foods, dyes, or preservatives -pregnant or trying to get pregnant -breast-feeding How should I use this medicine? This drug is given as an infusion into a vein. It is administered in a hospital or clinic by a specially trained health care professional. Talk to your pediatrician regarding the use of this medicine in children. Special care may be needed. Overdosage: If you think you have taken too much of this medicine contact a poison control center or emergency room at once. NOTE: This medicine is only for you. Do not share this medicine with others. What if I miss a dose? It is important not to miss your dose. Call your doctor or health care professional if you are unable to keep an appointment. What may interact with this medicine? Do not take this medicine with any of the following  medications: -atazanavir -certain medicines for fungal infections like itraconazole and ketoconazole -St. John's Wort This medicine may also interact with the following medications: -dexamethasone -diuretics -laxatives -medicines for seizures like carbamazepine, mephobarbital, phenobarbital, phenytoin, primidone -medicines to increase blood counts like filgrastim, pegfilgrastim, sargramostim -prochlorperazine -vaccines This list may not describe all possible interactions. Give your health care provider a list of all the medicines, herbs, non-prescription drugs, or dietary supplements you use. Also tell them if you smoke, drink alcohol, or use illegal drugs. Some items may interact with your medicine. What should I watch for while using this medicine? Your condition will be monitored carefully while you are receiving this medicine. You will need important blood work done while you are taking this medicine. This drug may make you feel generally unwell. This is not uncommon, as chemotherapy can affect healthy cells as well as cancer cells. Report any side effects. Continue your course of treatment even though you feel ill unless your doctor tells you to stop. In some cases, you may be given additional medicines to help with side effects. Follow all directions for their use. You may get drowsy or dizzy. Do not drive, use machinery, or do anything that needs mental alertness until you know how this medicine affects you. Do not stand or sit up quickly, especially if you are an older patient. This reduces the risk of dizzy or fainting spells. Call your doctor or health care professional for advice if you get a fever, chills or sore throat, or other symptoms of a cold or flu. Do not treat yourself. This drug decreases your body's ability to fight infections. Try to avoid being around people who are sick.  This medicine may increase your risk to bruise or bleed. Call your doctor or health care professional  if you notice any unusual bleeding. Be careful brushing and flossing your teeth or using a toothpick because you may get an infection or bleed more easily. If you have any dental work done, tell your dentist you are receiving this medicine. Avoid taking products that contain aspirin, acetaminophen, ibuprofen, naproxen, or ketoprofen unless instructed by your doctor. These medicines may hide a fever. Do not become pregnant while taking this medicine. Women should inform their doctor if they wish to become pregnant or think they might be pregnant. There is a potential for serious side effects to an unborn child. Talk to your health care professional or pharmacist for more information. Do not breast-feed an infant while taking this medicine. What side effects may I notice from receiving this medicine? Side effects that you should report to your doctor or health care professional as soon as possible: -allergic reactions like skin rash, itching or hives, swelling of the face, lips, or tongue -low blood counts - this medicine may decrease the number of white blood cells, red blood cells and platelets. You may be at increased risk for infections and bleeding. -signs of infection - fever or chills, cough, sore throat, pain or difficulty passing urine -signs of decreased platelets or bleeding - bruising, pinpoint red spots on the skin, black, tarry stools, blood in the urine -signs of decreased red blood cells - unusually weak or tired, fainting spells, lightheadedness -breathing problems -chest pain -diarrhea -feeling faint or lightheaded, falls -flushing, runny nose, sweating during infusion -mouth sores or pain -pain, swelling, redness or irritation where injected -pain, swelling, warmth in the leg -pain, tingling, numbness in the hands or feet -problems with balance, talking, walking -stomach cramps, pain -trouble passing urine or change in the amount of urine -vomiting as to be unable to hold down  drinks or food -yellowing of the eyes or skin Side effects that usually do not require medical attention (report to your doctor or health care professional if they continue or are bothersome): -constipation -hair loss -headache -loss of appetite -nausea, vomiting -stomach upset This list may not describe all possible side effects. Call your doctor for medical advice about side effects. You may report side effects to FDA at 1-800-FDA-1088. Where should I keep my medicine? This drug is given in a hospital or clinic and will not be stored at home. NOTE: This sheet is a summary. It may not cover all possible information. If you have questions about this medicine, talk to your doctor, pharmacist, or health care provider.  2015, Elsevier/Gold Standard. (2012-08-01 16:29:32)    If you develop nausea and vomiting that is not controlled by your nausea medication, call the clinic.   BELOW ARE SYMPTOMS THAT SHOULD BE REPORTED IMMEDIATELY:  *FEVER GREATER THAN 100.5 F  *CHILLS WITH OR WITHOUT FEVER  NAUSEA AND VOMITING THAT IS NOT CONTROLLED WITH YOUR NAUSEA MEDICATION  *UNUSUAL SHORTNESS OF BREATH  *UNUSUAL BRUISING OR BLEEDING  TENDERNESS IN MOUTH AND THROAT WITH OR WITHOUT PRESENCE OF ULCERS  *URINARY PROBLEMS  *BOWEL PROBLEMS  UNUSUAL RASH Items with * indicate a potential emergency and should be followed up as soon as possible.  Feel free to call the clinic you have any questions or concerns. The clinic phone number is [phone].

## 2013-11-09 NOTE — Progress Notes (Signed)
Spoke with Dr.Sherrill about pt's decreased ANC of 0.9. Per Dr.Sherrill, may proceed with treatment.  Nausea medication to be called to [facility] on [address], [place] per Manuela Schwartz, [place] nurse.

## 2013-11-09 NOTE — Progress Notes (Signed)
1138 patient has received almost complete dose of irinotecan/leucovorin and reports nausea, attributing this to feeling hungry, and feeling "warm" chemo paused for VS check. 1140 VSS with lower HR but patient reports his symptoms have passed and is back to baseline. He is currently eating [place] sandwich. Pt denies any other changes such as changes in breathing or chest pain. Chemo restarted.   Patient reports 1 transient episode of abdominal cramping without diarrhea at end of irinotecan infusion. Patient given atropine 0.5mg  per protocol prior to 5FU push and pump connect.

## 2013-11-09 NOTE — Telephone Encounter (Signed)
Per pharmacy I have scheduled appt for injection

## 2013-11-10 ENCOUNTER — Telehealth: Payer: Self-pay | Admitting: *Deleted

## 2013-11-10 NOTE — Telephone Encounter (Signed)
Message copied by Cherylynn Ridges on Fri Nov 10, 2013  5:02 PM ------      Message from: Ortencia Kick E      Created: Thu Nov 09, 2013  9:53 AM      Regarding: Follow-up first time Irinoctecan       Pt of Dr. Gearldine Shown, first time Irinotecan. Previous 5FU, Avastin and Leucovorin. Will return 9/26 for pump D/C ------

## 2013-11-10 NOTE — Telephone Encounter (Signed)
[place] for chemotherapy F/U.  Patient is doing well.  Denies n/v.  Denies any new side effects or symptoms.  Does admit to having hiccups.  "They last about three minutes, go away but return often throughout my day."  Asked that he call if a nuisance or become continuous.  Bowel and bladder is functioning well.  Eating and drinking well and I instructed to drink 64 oz minimum daily or at least the day before, of and after treatment.  Denies further questions at this time and encouraged to call if needed.  Reviewed how to call after hours in the case of an emergency.

## 2013-11-11 ENCOUNTER — Ambulatory Visit (HOSPITAL_BASED_OUTPATIENT_CLINIC_OR_DEPARTMENT_OTHER): Payer: 59

## 2013-11-11 ENCOUNTER — Ambulatory Visit: Payer: 59

## 2013-11-11 VITALS — BP 116/82 | HR 53 | Temp 98.4°F | Resp 16

## 2013-11-11 DIAGNOSIS — C787 Secondary malignant neoplasm of liver and intrahepatic bile duct: Secondary | ICD-10-CM

## 2013-11-11 DIAGNOSIS — C189 Malignant neoplasm of colon, unspecified: Secondary | ICD-10-CM

## 2013-11-11 DIAGNOSIS — Z5189 Encounter for other specified aftercare: Secondary | ICD-10-CM

## 2013-11-11 DIAGNOSIS — C187 Malignant neoplasm of sigmoid colon: Secondary | ICD-10-CM

## 2013-11-11 MED ORDER — SODIUM CHLORIDE 0.9 % IJ SOLN
10.0000 mL | INTRAMUSCULAR | Status: DC | PRN
  Administered 2013-11-11: 10 mL
  Filled 2013-11-11: qty 10

## 2013-11-11 MED ORDER — HEPARIN SOD (PORK) LOCK FLUSH 100 UNIT/ML IV SOLN
500.0000 [IU] | Freq: Once | INTRAVENOUS | Status: AC | PRN
  Administered 2013-11-11: 500 [IU]
  Filled 2013-11-11: qty 5

## 2013-11-11 MED ORDER — PEGFILGRASTIM INJECTION 6 MG/0.6ML
6.0000 mg | Freq: Once | SUBCUTANEOUS | Status: AC
  Administered 2013-11-11: 6 mg via SUBCUTANEOUS

## 2013-11-11 NOTE — Progress Notes (Signed)
Patient states he is feeling good and slept very well last night.  Reviewed his next. Appts. With him

## 2013-11-14 ENCOUNTER — Telehealth: Payer: Self-pay | Admitting: *Deleted

## 2013-11-14 NOTE — Telephone Encounter (Signed)
Message from pt reporting heartburn. Taking Peptaid, this is not helping. Attempted to return call, mailbox is full.

## 2013-11-16 ENCOUNTER — Other Ambulatory Visit: Payer: 59

## 2013-11-16 ENCOUNTER — Ambulatory Visit: Payer: 59 | Admitting: Nurse Practitioner

## 2013-11-16 ENCOUNTER — Ambulatory Visit: Payer: 59

## 2013-11-18 ENCOUNTER — Other Ambulatory Visit: Payer: Self-pay | Admitting: Oncology

## 2013-11-23 ENCOUNTER — Other Ambulatory Visit (HOSPITAL_BASED_OUTPATIENT_CLINIC_OR_DEPARTMENT_OTHER): Payer: 59

## 2013-11-23 ENCOUNTER — Ambulatory Visit (HOSPITAL_BASED_OUTPATIENT_CLINIC_OR_DEPARTMENT_OTHER): Payer: 59

## 2013-11-23 ENCOUNTER — Ambulatory Visit (HOSPITAL_BASED_OUTPATIENT_CLINIC_OR_DEPARTMENT_OTHER): Payer: 59 | Admitting: Nurse Practitioner

## 2013-11-23 ENCOUNTER — Telehealth: Payer: Self-pay | Admitting: Oncology

## 2013-11-23 VITALS — BP 152/88 | HR 64 | Temp 97.8°F | Resp 18 | Ht 71.0 in | Wt 217.6 lb

## 2013-11-23 VITALS — BP 122/80 | HR 58

## 2013-11-23 DIAGNOSIS — Z5111 Encounter for antineoplastic chemotherapy: Secondary | ICD-10-CM

## 2013-11-23 DIAGNOSIS — C187 Malignant neoplasm of sigmoid colon: Secondary | ICD-10-CM

## 2013-11-23 DIAGNOSIS — C7800 Secondary malignant neoplasm of unspecified lung: Secondary | ICD-10-CM

## 2013-11-23 DIAGNOSIS — C189 Malignant neoplasm of colon, unspecified: Secondary | ICD-10-CM

## 2013-11-23 DIAGNOSIS — Z5112 Encounter for antineoplastic immunotherapy: Secondary | ICD-10-CM

## 2013-11-23 DIAGNOSIS — C787 Secondary malignant neoplasm of liver and intrahepatic bile duct: Secondary | ICD-10-CM

## 2013-11-23 LAB — CBC WITH DIFFERENTIAL/PLATELET
BASO%: 0.2 % (ref 0.0–2.0)
BASOS ABS: 0 10*3/uL (ref 0.0–0.1)
EOS%: 1.4 % (ref 0.0–7.0)
Eosinophils Absolute: 0.1 10*3/uL (ref 0.0–0.5)
HCT: 38.7 % (ref 38.4–49.9)
HEMOGLOBIN: 12.5 g/dL — AB (ref 13.0–17.1)
LYMPH%: 33.9 % (ref 14.0–49.0)
MCH: 31.2 pg (ref 27.2–33.4)
MCHC: 32.3 g/dL (ref 32.0–36.0)
MCV: 96.5 fL (ref 79.3–98.0)
MONO#: 0.7 10*3/uL (ref 0.1–0.9)
MONO%: 15.2 % — AB (ref 0.0–14.0)
NEUT%: 49.3 % (ref 39.0–75.0)
NEUTROS ABS: 2.2 10*3/uL (ref 1.5–6.5)
PLATELETS: 100 10*3/uL — AB (ref 140–400)
RBC: 4.01 10*6/uL — ABNORMAL LOW (ref 4.20–5.82)
RDW: 16.6 % — AB (ref 11.0–14.6)
WBC: 4.4 10*3/uL (ref 4.0–10.3)
lymph#: 1.5 10*3/uL (ref 0.9–3.3)

## 2013-11-23 LAB — COMPREHENSIVE METABOLIC PANEL (CC13)
ALK PHOS: 97 U/L (ref 40–150)
ALT: 42 U/L (ref 0–55)
AST: 39 U/L — ABNORMAL HIGH (ref 5–34)
Albumin: 3.5 g/dL (ref 3.5–5.0)
Anion Gap: 7 mEq/L (ref 3–11)
BUN: 20.1 mg/dL (ref 7.0–26.0)
CALCIUM: 9.4 mg/dL (ref 8.4–10.4)
CO2: 27 mEq/L (ref 22–29)
Chloride: 104 mEq/L (ref 98–109)
Creatinine: 1.4 mg/dL — ABNORMAL HIGH (ref 0.7–1.3)
GLUCOSE: 106 mg/dL (ref 70–140)
Potassium: 4 mEq/L (ref 3.5–5.1)
Sodium: 138 mEq/L (ref 136–145)
TOTAL PROTEIN: 7.2 g/dL (ref 6.4–8.3)
Total Bilirubin: 0.51 mg/dL (ref 0.20–1.20)

## 2013-11-23 LAB — UA PROTEIN, DIPSTICK - CHCC: PROTEIN: NEGATIVE mg/dL

## 2013-11-23 MED ORDER — LEUCOVORIN CALCIUM INJECTION 350 MG
398.0000 mg/m2 | Freq: Once | INTRAVENOUS | Status: AC
  Administered 2013-11-23: 880 mg via INTRAVENOUS
  Filled 2013-11-23: qty 44

## 2013-11-23 MED ORDER — ONDANSETRON 16 MG/50ML IVPB (CHCC)
INTRAVENOUS | Status: AC
  Filled 2013-11-23: qty 16

## 2013-11-23 MED ORDER — SODIUM CHLORIDE 0.9 % IV SOLN
2400.0000 mg/m2 | INTRAVENOUS | Status: DC
  Administered 2013-11-23: 5300 mg via INTRAVENOUS
  Filled 2013-11-23: qty 106

## 2013-11-23 MED ORDER — SODIUM CHLORIDE 0.9 % IV SOLN
Freq: Once | INTRAVENOUS | Status: AC
  Administered 2013-11-23: 12:00:00 via INTRAVENOUS

## 2013-11-23 MED ORDER — FLUOROURACIL CHEMO INJECTION 2.5 GM/50ML
400.0000 mg/m2 | Freq: Once | INTRAVENOUS | Status: AC
  Administered 2013-11-23: 900 mg via INTRAVENOUS
  Filled 2013-11-23: qty 18

## 2013-11-23 MED ORDER — DEXAMETHASONE SODIUM PHOSPHATE 20 MG/5ML IJ SOLN
20.0000 mg | Freq: Once | INTRAMUSCULAR | Status: AC
  Administered 2013-11-23: 20 mg via INTRAVENOUS

## 2013-11-23 MED ORDER — ONDANSETRON 16 MG/50ML IVPB (CHCC)
16.0000 mg | Freq: Once | INTRAVENOUS | Status: AC
  Administered 2013-11-23: 16 mg via INTRAVENOUS

## 2013-11-23 MED ORDER — DEXAMETHASONE SODIUM PHOSPHATE 20 MG/5ML IJ SOLN
INTRAMUSCULAR | Status: AC
  Filled 2013-11-23: qty 5

## 2013-11-23 MED ORDER — IRINOTECAN HCL CHEMO INJECTION 100 MG/5ML
125.0000 mg/m2 | Freq: Once | INTRAVENOUS | Status: AC
  Administered 2013-11-23: 276 mg via INTRAVENOUS
  Filled 2013-11-23: qty 13.8

## 2013-11-23 MED ORDER — ATROPINE SULFATE 1 MG/ML IJ SOLN
0.5000 mg | Freq: Once | INTRAMUSCULAR | Status: AC | PRN
  Administered 2013-11-23: 0.5 mg via INTRAVENOUS

## 2013-11-23 MED ORDER — ATROPINE SULFATE 1 MG/ML IJ SOLN
INTRAMUSCULAR | Status: AC
  Filled 2013-11-23: qty 1

## 2013-11-23 MED ORDER — SODIUM CHLORIDE 0.9 % IV SOLN
5.0000 mg/kg | Freq: Once | INTRAVENOUS | Status: AC
  Administered 2013-11-23: 475 mg via INTRAVENOUS
  Filled 2013-11-23: qty 19

## 2013-11-23 NOTE — Patient Instructions (Signed)
[facility] Discharge Instructions for Patients Receiving Chemotherapy  Today you received the following chemotherapy agents avastin/leucovorin/irinotecan/fluorouracil  To help prevent nausea and vomiting after your treatment, we encourage you to take your nausea medication as directed   If you develop nausea and vomiting that is not controlled by your nausea medication, call the clinic.   BELOW ARE SYMPTOMS THAT SHOULD BE REPORTED IMMEDIATELY:  *FEVER GREATER THAN 100.5 F  *CHILLS WITH OR WITHOUT FEVER  NAUSEA AND VOMITING THAT IS NOT CONTROLLED WITH YOUR NAUSEA MEDICATION  *UNUSUAL SHORTNESS OF BREATH  *UNUSUAL BRUISING OR BLEEDING  TENDERNESS IN MOUTH AND THROAT WITH OR WITHOUT PRESENCE OF ULCERS  *URINARY PROBLEMS  *BOWEL PROBLEMS  UNUSUAL RASH Items with * indicate a potential emergency and should be followed up as soon as possible.  Feel free to call the clinic you have any questions or concerns. The clinic phone number is [phone].  

## 2013-11-23 NOTE — Telephone Encounter (Signed)
gv pt avs and sent msg to tiffany M and Blima Singer to send all records

## 2013-11-23 NOTE — Progress Notes (Signed)
[facility] OFFICE PROGRESS NOTE   Diagnosis:  Colon cancer  INTERVAL HISTORY:   Christopher Fuentes returns as scheduled. He completed cycle 1 of FOLFIRI/Avastin on 11/09/2013. He denies nausea/vomiting. No diarrhea. He developed a single mouth sore following chemotherapy. He thinks the mouth sore is related to something he ate that caused a "burn" rather than the chemotherapy. He denies bleeding. No shortness of breath or chest pain. No leg swelling or calf pain. He denies abdominal pain.  He reports he will be relocating to [facility] in the next few weeks to begin a new job.  Objective:  Vital signs in last 24 hours:  Blood pressure 152/88, pulse 64, temperature 97.8 F (36.6 C), temperature source Oral, resp. rate 18, height _0  (1.803 m), weight 217 lb 9.6 oz (98.703 kg), SpO2 100.00%.    HEENT: Small healing ulceration left lower lateral inner lip. Resp: Lungs clear bilaterally. Cardio: Regular rate and rhythm. GI: Abdomen soft and nontender. No hepatomegaly. Vascular: No leg edema. Skin: Palms with hyperpigmentation, dryness.  Port-A-Cath without erythema.   Lab Results:  Lab Results  Component Value Date   WBC 4.4 11/23/2013   HGB 12.5* 11/23/2013   HCT 38.7 11/23/2013   MCV 96.5 11/23/2013   PLT 100* 11/23/2013   NEUTROABS 2.2 11/23/2013    Imaging:  No results found.  Medications: I have reviewed the patient's current medications.  Assessment/Plan: 1. Metastatic colon cancer diagnosed in December 2013, status post a partial colectomy for a T3 N0 tumor, K-ras wild-type by standard testing. No loss of mismatch repair protein expression, K-ras exon 4 mutation detected, NRAS negative,BRAF negative CTs of the chest, abdomen, and pelvis February 2014 confirmed multiple bilateral lung masses, a liver lesion, and small mesenteric lymph nodes-not pathologically enlarged.  Segment 5 right liver biopsy February 2014 confirmed metastatic adenocarcinoma  consistent with colon origin.  FOLFOX 6+ Avastin February 2014 through 12/05/2012.  Staging PET scan 03/01/2013 with 2 foci of hypermetabolism in the liver, numerous bilateral pulmonary nodules-subcentimeter in size, none of the pulmonary nodules were hypermetabolic but were highly suspicious for metastases.  Initiation of Xeloda/Avastin on a 3 week schedule beginning 03/17/2013.  Restaging CT chest/abdomen/pelvis 08/14/2013. Index lesion right upper lobe 8 mm previously 7 mm; index left upper lobe lesion 10 mm previously 8 mm; some additional nodules in both lungs showed general stability with additional nodules in the lower lobes not definitely visualized on the prior exam which was limited by atelectasis. Foci of hypermetabolism within liver on prior PET CT not identified on current CT exam.  Restaging CT 10/11/2011 with progression of a liver lesion and a mixed response involving lung metastases. Cycle 1 FOLFIRI/Avastin 11/09/2013. 2. Neutropenia secondary to chemotherapy 07/06/2013. Improved 07/11/2013. Xeloda dose reduced to 1500 mg twice daily.  3. 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   Disposition: Christopher Fuentes appears stable. He has completed one cycle of FOLFIRI/Avastin. Plan to proceed with cycle 2 today as scheduled. He will again receive Neulasta on the day of pump discontinuation.  The platelet count is mildly decreased. He understands to contact the office with any bleeding.  At today's visit he notified us he is moving to [facility]. We made a referral to [facility] and Hematology.  We did not schedule followup in our office. We will be happy to see him in the future if he returns to this area.  Plan reviewed with Dr. Benay Spice.    Ned Card ANP/GNP-BC   11/23/2013  11:21  AM

## 2013-11-25 ENCOUNTER — Ambulatory Visit: Payer: 59

## 2013-11-25 ENCOUNTER — Ambulatory Visit (HOSPITAL_BASED_OUTPATIENT_CLINIC_OR_DEPARTMENT_OTHER): Payer: 59

## 2013-11-25 VITALS — BP 118/79 | HR 67 | Temp 98.6°F | Resp 18

## 2013-11-25 DIAGNOSIS — C787 Secondary malignant neoplasm of liver and intrahepatic bile duct: Secondary | ICD-10-CM

## 2013-11-25 DIAGNOSIS — Z5189 Encounter for other specified aftercare: Secondary | ICD-10-CM

## 2013-11-25 DIAGNOSIS — C7800 Secondary malignant neoplasm of unspecified lung: Secondary | ICD-10-CM

## 2013-11-25 DIAGNOSIS — C187 Malignant neoplasm of sigmoid colon: Secondary | ICD-10-CM

## 2013-11-25 DIAGNOSIS — C189 Malignant neoplasm of colon, unspecified: Secondary | ICD-10-CM

## 2013-11-25 MED ORDER — SODIUM CHLORIDE 0.9 % IJ SOLN
10.0000 mL | INTRAMUSCULAR | Status: DC | PRN
  Administered 2013-11-25: 10 mL
  Filled 2013-11-25: qty 10

## 2013-11-25 MED ORDER — HEPARIN SOD (PORK) LOCK FLUSH 100 UNIT/ML IV SOLN
500.0000 [IU] | Freq: Once | INTRAVENOUS | Status: AC | PRN
  Administered 2013-11-25: 500 [IU]
  Filled 2013-11-25: qty 5

## 2013-11-25 MED ORDER — PEGFILGRASTIM INJECTION 6 MG/0.6ML
6.0000 mg | Freq: Once | SUBCUTANEOUS | Status: AC
  Administered 2013-11-25: 6 mg via SUBCUTANEOUS

## 2013-11-25 NOTE — Patient Instructions (Signed)
Pegfilgrastim injection What is this medicine? PEGFILGRASTIM (peg fil GRA stim) is a long-acting granulocyte colony-stimulating factor that stimulates the growth of neutrophils, a type of white blood cell important in the body's fight against infection. It is used to reduce the incidence of fever and infection in patients with certain types of cancer who are receiving chemotherapy that affects the bone marrow. This medicine may be used for other purposes; ask your health care provider or pharmacist if you have questions. COMMON BRAND NAME(S): Neulasta What should I tell my health care provider before I take this medicine? They need to know if you have any of these conditions: -latex allergy -ongoing radiation therapy -sickle cell disease -skin reactions to acrylic adhesives (On-Body Injector only) -an unusual or allergic reaction to pegfilgrastim, filgrastim, other medicines, foods, dyes, or preservatives -pregnant or trying to get pregnant -breast-feeding How should I use this medicine? This medicine is for injection under the skin. If you get this medicine at home, you will be taught how to prepare and give the pre-filled syringe or how to use the On-body Injector. Refer to the patient Instructions for Use for detailed instructions. Use exactly as directed. Take your medicine at regular intervals. Do not take your medicine more often than directed. It is important that you put your used needles and syringes in a special sharps container. Do not put them in a trash can. If you do not have a sharps container, call your pharmacist or healthcare provider to get one. Talk to your pediatrician regarding the use of this medicine in children. Special care may be needed. Overdosage: If you think you have taken too much of this medicine contact a poison control center or emergency room at once. NOTE: This medicine is only for you. Do not share this medicine with others. What if I miss a dose? It is  important not to miss your dose. Call your doctor or health care professional if you miss your dose. If you miss a dose due to an On-body Injector failure or leakage, a new dose should be administered as soon as possible using a single prefilled syringe for manual use. What may interact with this medicine? Interactions have not been studied. Give your health care provider a list of all the medicines, herbs, non-prescription drugs, or dietary supplements you use. Also tell them if you smoke, drink alcohol, or use illegal drugs. Some items may interact with your medicine. This list may not describe all possible interactions. Give your health care provider a list of all the medicines, herbs, non-prescription drugs, or dietary supplements you use. Also tell them if you smoke, drink alcohol, or use illegal drugs. Some items may interact with your medicine. What should I watch for while using this medicine? You may need blood work done while you are taking this medicine. If you are going to need a MRI, CT scan, or other procedure, tell your doctor that you are using this medicine (On-Body Injector only). What side effects may I notice from receiving this medicine? Side effects that you should report to your doctor or health care professional as soon as possible: -allergic reactions like skin rash, itching or hives, swelling of the face, lips, or tongue -dizziness -fever -pain, redness, or irritation at site where injected -pinpoint red spots on the skin -shortness of breath or breathing problems -stomach or side pain, or pain at the shoulder -swelling -tiredness -trouble passing urine Side effects that usually do not require medical attention (report to your doctor   or health care professional if they continue or are bothersome): -bone pain -muscle pain This list may not describe all possible side effects. Call your doctor for medical advice about side effects. You may report side effects to FDA at  1-800-FDA-1088. Where should I keep my medicine? Keep out of the reach of children. Store pre-filled syringes in a refrigerator between 2 and 8 degrees C (36 and 46 degrees F). Do not freeze. Keep in carton to protect from light. Throw away this medicine if it is left out of the refrigerator for more than 48 hours. Throw away any unused medicine after the expiration date. NOTE: This sheet is a summary. It may not cover all possible information. If you have questions about this medicine, talk to your doctor, pharmacist, or health care provider.  2015, Elsevier/Gold Standard. (2013-05-04 16:14:05)  

## 2013-11-27 ENCOUNTER — Telehealth: Payer: Self-pay | Admitting: Oncology

## 2013-11-27 NOTE — Telephone Encounter (Signed)
Faxed pt medical records to [facility]. In [place].  The office coordinator will call pt with appt.

## 2013-11-28 ENCOUNTER — Encounter: Payer: Self-pay | Admitting: Oncology

## 2013-11-28 NOTE — Progress Notes (Signed)
Insurance is paying neulasta.

## 2013-12-01 ENCOUNTER — Telehealth: Payer: Self-pay | Admitting: Oncology

## 2013-12-01 NOTE — Telephone Encounter (Signed)
Pt appt. With Dr. Ardeen Garland @ [facility] in [place] is 12/05/13@1 :00. Pt is aware.

## 2014-01-30 ENCOUNTER — Encounter: Payer: Self-pay | Admitting: Oncology

## 2014-01-30 NOTE — Progress Notes (Signed)
Patient no longer active treatment here....Marland Kitchenno asst needed for avastin.

## 2014-01-30 NOTE — Progress Notes (Signed)
Patient has asst for Avastin [id number] 07/28/13-07/26/24  24,000 patient LN#[id number]

## 2014-12-23 IMAGING — CT CT CHEST W/ CM
2 of 5 series · 15 of 46 positions shown, 17 images · IV contrast (OMNIPAQUE)
Comparison: CT of the chest, abdomen and pelvis 08/14/2013.

CLINICAL DATA: History of colon cancer. Elevated tumor markers.
Previous chemotherapy currently undergoing maintenance chemotherapy.

EXAM:
CT CHEST, ABDOMEN, AND PELVIS WITH CONTRAST
TECHNIQUE: Multidetector CT imaging of the chest, abdomen and pelvis was
performed following the standard protocol during bolus
administration of intravenous contrast.
CONTRAST:  100mL OMNIPAQUE IOHEXOL 300 MG/ML  SOLN

[Series 2: cap with st · axial · 0.80mm/px · z∈[+411,+1006]mm · 12 of 135 slices shown, 14 images]
[im 8/135  soft-tissue]
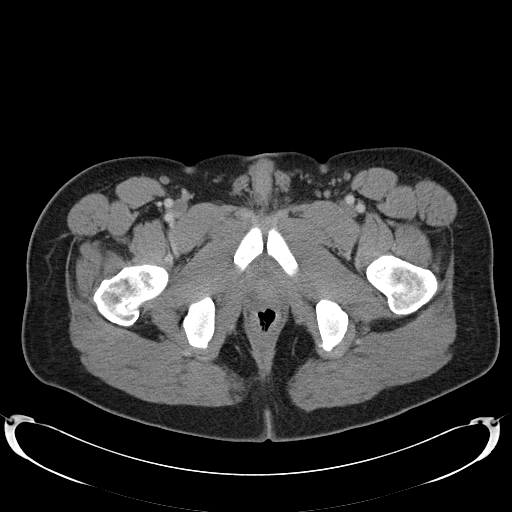
[im 8/135  bone]
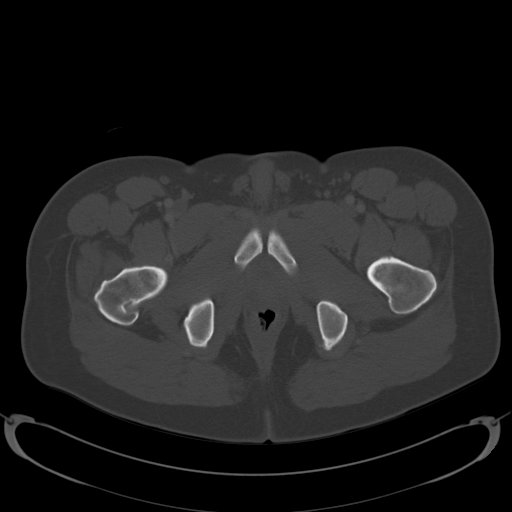
[im 23/135  soft-tissue]
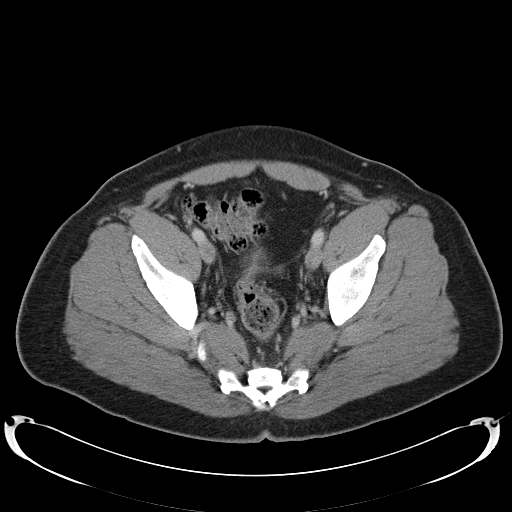
[im 30/135  soft-tissue]
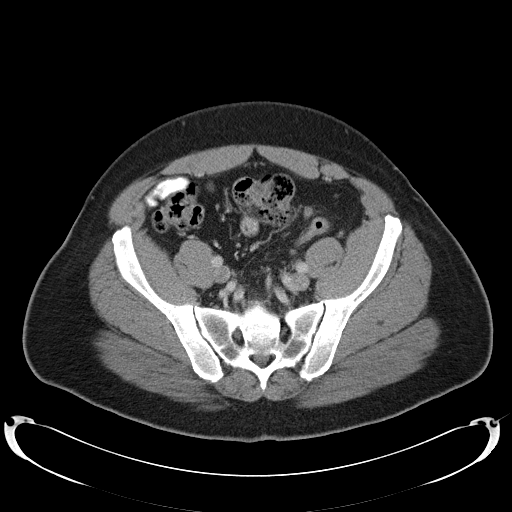
[im 38/135  soft-tissue]
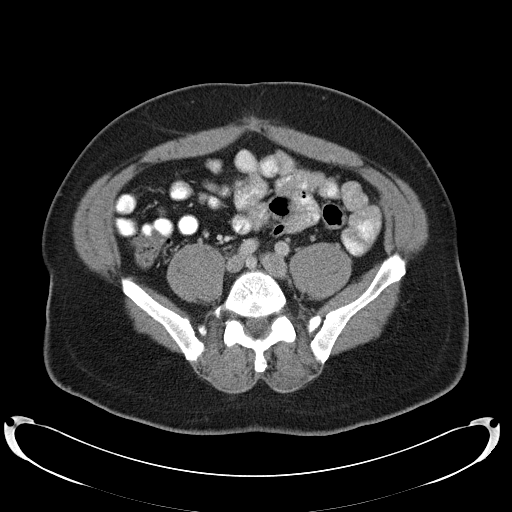
[im 53/135  soft-tissue]
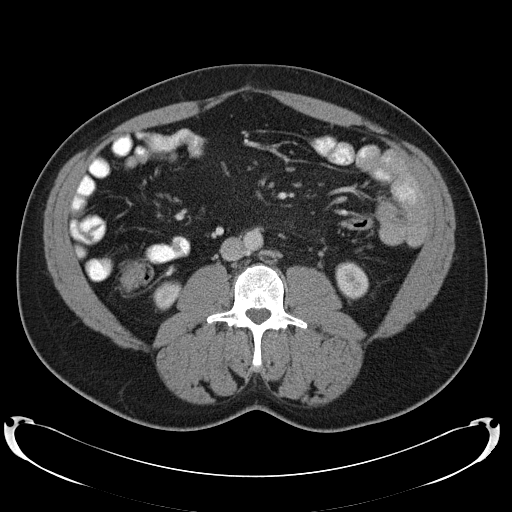
[im 60/135  soft-tissue]
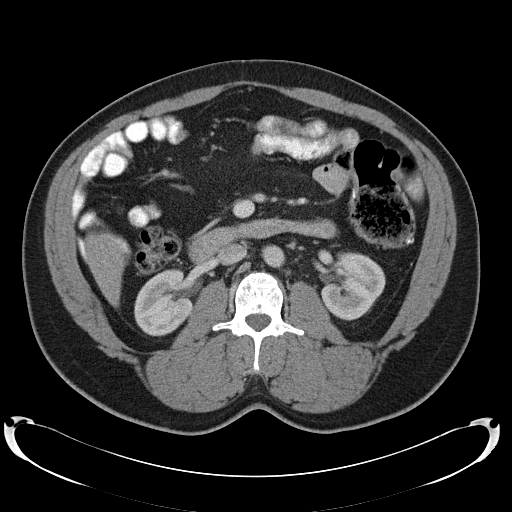
[im 75/135  soft-tissue]
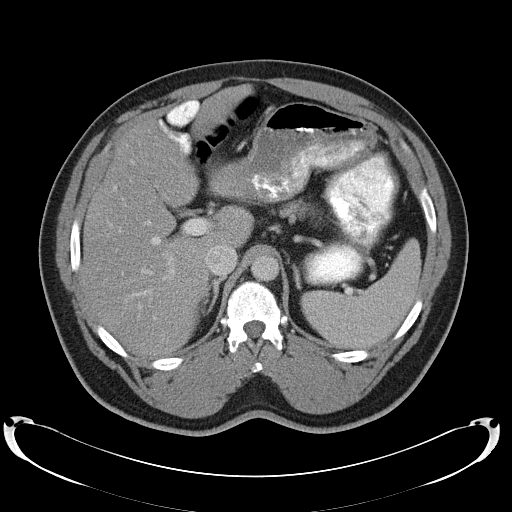
[im 82/135  soft-tissue]
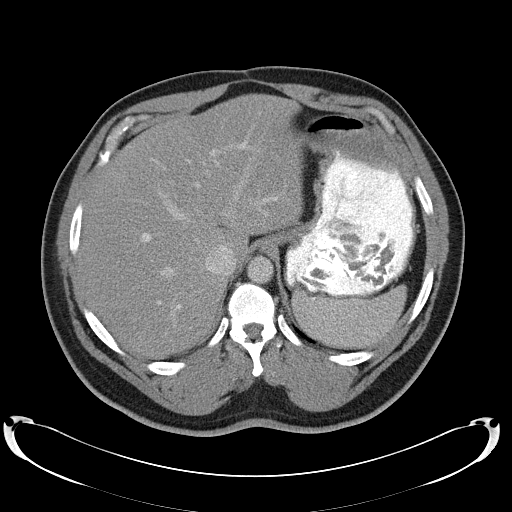
[im 97/135  soft-tissue]
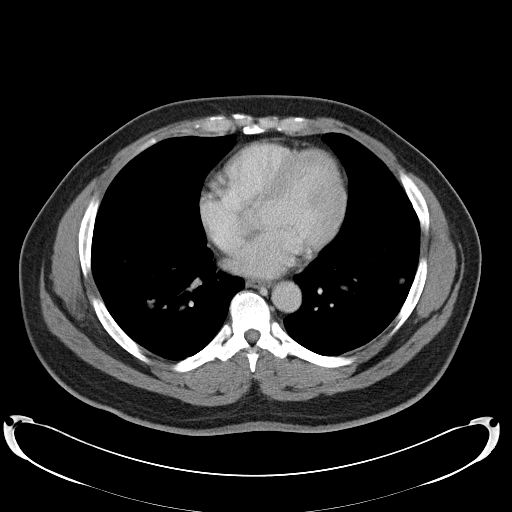
[im 97/135  bone]
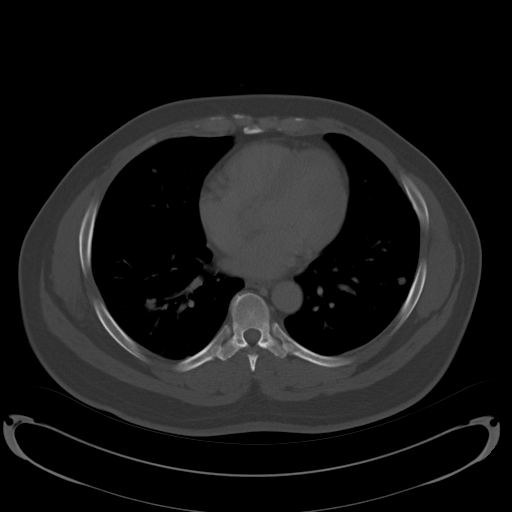
[im 105/135  soft-tissue]
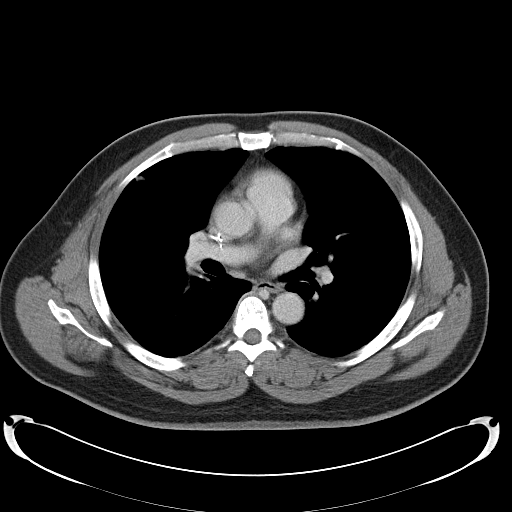
[im 112/135  soft-tissue]
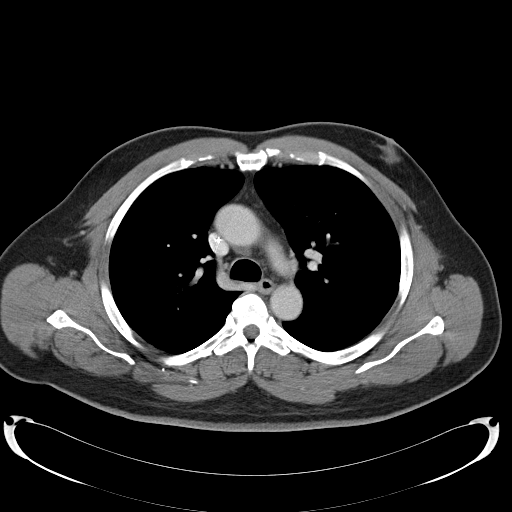
[im 127/135  soft-tissue]
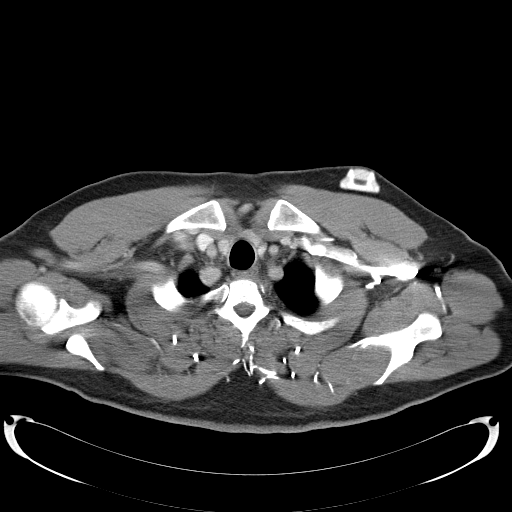

[Series 602: coronal images · coronal · 1.31mm/px · 3 of 107 slices shown]
[im 36/107  soft-tissue]
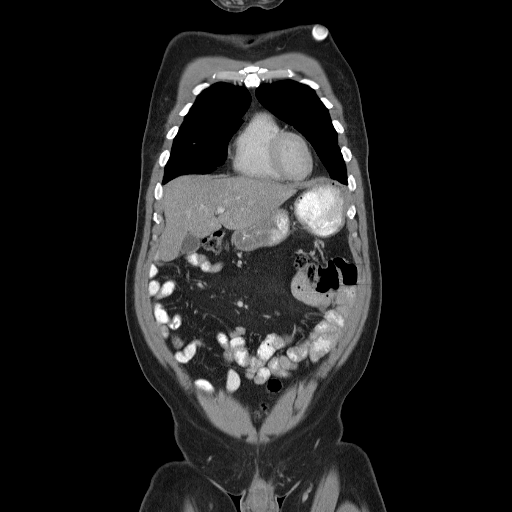
[im 48/107  soft-tissue]
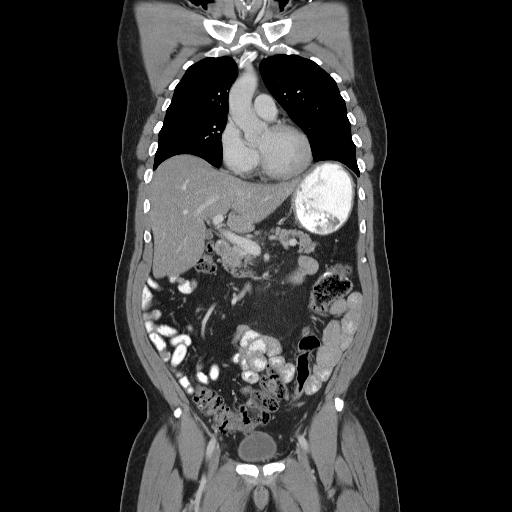
[im 59/107  soft-tissue]
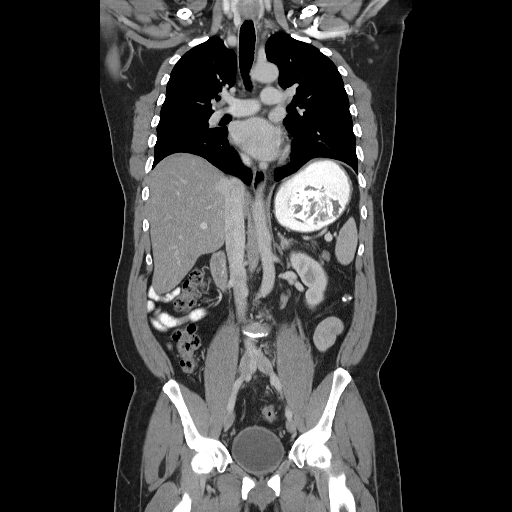

[15 of 46 positions shown; findings below may reference images not displayed]

FINDINGS: CT CHEST FINDINGS

Mediastinum: Left subclavian single-lumen porta cath with tip
terminating at the superior cavoatrial junction. Heart size is
normal. There is no significant pericardial fluid, thickening or
pericardial calcification. No pathologically enlarged mediastinal or
hilar lymph nodes. Esophagus is unremarkable in appearance.

Lungs/Pleura: Again noted are multiple pulmonary nodules, compatible
with metastatic disease. Some of these nodules appear slightly
smaller than the prior examination, best demonstrated by a 6 mm
nodule in the periphery of the right lower lobe) image 32 of series
4), which previously measured 8 mm on 08/14/2013. Other nodules
appear essentially unchanged, including a 1 cm left upper lobe
nodule (image 28 of series 4). However, there are several nodules
that appear slightly larger than the prior examination, including a
10 mm nodule in the periphery of the right upper lobe (image 31 of
series 4), which previously measured only 8 mm, a 12 mm
macrolobulated nodule in the right lower lobe (image 39 of series
4), which previously measured 10 mm, and a 8 mm nodule in the
periphery of the left lower lobe (image 39 of series 4), which
previously measured 7 mm. No definite new nodules are noted. No
acute consolidative airspace disease. No pleural effusions.

Musculoskeletal: Lucent lesion with sclerotic margin and narrow zone
of transition in the lateral aspect of the right fifth rib is
unchanged compared to prior examinations, and was not hypermetabolic
on prior PET-CT 03/01/2013, presumably a benign lesion. There are no
other aggressive appearing lytic or blastic lesions noted in the
visualized portions of the skeleton.

CT ABDOMEN AND PELVIS FINDINGS

Abdomen/Pelvis: There is a 2.7 x 2.0 cm hypovascular lesion in the
inferior aspect of the right lobe of the liver between segments 5
and 6 (image 66 of series 2), which corresponds to an area of
hypermetabolism on prior PET-CT 03/01/2013, most compatible with a
hepatic metastasis. The other area of hypermetabolism noted in the
left lobe of the liver demonstrates no CT correlate on today's
examination. No other hepatic lesions are confidently identified.

The appearance of the gallbladder, pancreas, spleen, bilateral
adrenal glands and bilateral kidneys is unremarkable. Normal
appendix. Postoperative changes of partial colectomy are noted in
the region of the splenic flexure, without evidence to suggest local
recurrence of disease. No significant volume of ascites. No
pneumoperitoneum. No pathologic distention of small bowel. No
lymphadenopathy identified in the abdomen or pelvis. Small splenule
immediately anterior to the spleen, unchanged. Prostate gland and
urinary bladder are unremarkable in appearance.

Musculoskeletal: There are no aggressive appearing lytic or blastic
lesions noted in the visualized portions of the skeleton.
IMPRESSION: 1. Today's study demonstrates a mixed response to therapy in terms
of the numerous pulmonary metastases, as some of these appear
slightly smaller while others are stable to slightly increased in
size.
2. Today's study also demonstrates progression of disease in terms
of the previously noted hepatic metastases. Specifically, the
previously noted lesion in the right lobe of the liver on PET-CT
03/01/2013 is now clearly identifiable on this CT examination as a
2.7 x 2.0 cm hypovascular lesion between segments 5 and 6 in the
liver.
3. No other new metastatic lesions are identified. Additionally, the
previously noted hypermetabolic lesion in the left lobe of the liver
is not clearly identified on today's examination.
4. Additional incidental findings, as above.

## 2015-01-17 DEATH — deceased

## 2016-02-08 ENCOUNTER — Other Ambulatory Visit: Payer: Self-pay | Admitting: Nurse Practitioner
